# Patient Record
Sex: Female | Born: 1945 | Race: White | Hispanic: No | Marital: Single | Smoking: Former smoker
Health system: Southern US, Community
[De-identification: ages and names within clinical notes are randomized; demographics above are authoritative.]

## PROBLEM LIST (undated history)

## (undated) DIAGNOSIS — E271 Primary adrenocortical insufficiency: Secondary | ICD-10-CM

## (undated) DIAGNOSIS — E039 Hypothyroidism, unspecified: Secondary | ICD-10-CM

## (undated) HISTORY — PX: TONSILLECTOMY: SUR1361

## (undated) HISTORY — PX: HEMORROIDECTOMY: SUR656

---

## 2020-06-05 ENCOUNTER — Inpatient Hospital Stay (HOSPITAL_COMMUNITY)
Admission: EM | Admit: 2020-06-05 | Discharge: 2020-06-07 | DRG: 644 | Disposition: A | Discharge status: Home / Self Care | Payer: Medicare Other | Attending: Family Medicine | Admitting: Family Medicine

## 2020-06-05 ENCOUNTER — Encounter (HOSPITAL_COMMUNITY): Payer: Self-pay

## 2020-06-05 DIAGNOSIS — Y929 Unspecified place or not applicable: Secondary | ICD-10-CM

## 2020-06-05 DIAGNOSIS — E272 Addisonian crisis: Secondary | ICD-10-CM | POA: Diagnosis not present

## 2020-06-05 DIAGNOSIS — E860 Dehydration: Secondary | ICD-10-CM | POA: Diagnosis present

## 2020-06-05 DIAGNOSIS — R5083 Postvaccination fever: Secondary | ICD-10-CM | POA: Diagnosis present

## 2020-06-05 DIAGNOSIS — B9689 Other specified bacterial agents as the cause of diseases classified elsewhere: Secondary | ICD-10-CM | POA: Diagnosis present

## 2020-06-05 DIAGNOSIS — Z79899 Other long term (current) drug therapy: Secondary | ICD-10-CM

## 2020-06-05 DIAGNOSIS — Z87891 Personal history of nicotine dependence: Secondary | ICD-10-CM

## 2020-06-05 DIAGNOSIS — E876 Hypokalemia: Secondary | ICD-10-CM | POA: Diagnosis present

## 2020-06-05 DIAGNOSIS — E869 Volume depletion, unspecified: Secondary | ICD-10-CM | POA: Diagnosis present

## 2020-06-05 DIAGNOSIS — D849 Immunodeficiency, unspecified: Secondary | ICD-10-CM | POA: Diagnosis present

## 2020-06-05 DIAGNOSIS — E039 Hypothyroidism, unspecified: Secondary | ICD-10-CM | POA: Diagnosis present

## 2020-06-05 DIAGNOSIS — E875 Hyperkalemia: Secondary | ICD-10-CM | POA: Diagnosis present

## 2020-06-05 DIAGNOSIS — R8271 Bacteriuria: Secondary | ICD-10-CM | POA: Diagnosis present

## 2020-06-05 DIAGNOSIS — Z7952 Long term (current) use of systemic steroids: Secondary | ICD-10-CM

## 2020-06-05 DIAGNOSIS — T50B95A Adverse effect of other viral vaccines, initial encounter: Secondary | ICD-10-CM | POA: Diagnosis present

## 2020-06-05 DIAGNOSIS — N390 Urinary tract infection, site not specified: Secondary | ICD-10-CM | POA: Diagnosis present

## 2020-06-05 DIAGNOSIS — Z20822 Contact with and (suspected) exposure to covid-19: Secondary | ICD-10-CM | POA: Diagnosis present

## 2020-06-05 DIAGNOSIS — Z8249 Family history of ischemic heart disease and other diseases of the circulatory system: Secondary | ICD-10-CM

## 2020-06-05 DIAGNOSIS — E871 Hypo-osmolality and hyponatremia: Secondary | ICD-10-CM | POA: Diagnosis present

## 2020-06-05 DIAGNOSIS — E271 Primary adrenocortical insufficiency: Secondary | ICD-10-CM

## 2020-06-05 DIAGNOSIS — Z79890 Hormone replacement therapy (postmenopausal): Secondary | ICD-10-CM

## 2020-06-05 HISTORY — DX: Primary adrenocortical insufficiency: E27.1

## 2020-06-05 LAB — COMPREHENSIVE METABOLIC PANEL
ALT: 27 U/L (ref 0–44)
AST: 34 U/L (ref 15–41)
Albumin: 4.2 g/dL (ref 3.5–5.0)
Alkaline Phosphatase: 57 U/L (ref 38–126)
Anion gap: 17 — ABNORMAL HIGH (ref 5–15)
BUN: 36 mg/dL — ABNORMAL HIGH (ref 8–23)
CO2: 19 mmol/L — ABNORMAL LOW (ref 22–32)
Calcium: 9.2 mg/dL (ref 8.9–10.3)
Chloride: 97 mmol/L — ABNORMAL LOW (ref 98–111)
Creatinine, Ser: 1.42 mg/dL — ABNORMAL HIGH (ref 0.44–1.00)
GFR calc Af Amer: 41 mL/min — ABNORMAL LOW (ref >60)
GFR calc non Af Amer: 36 mL/min — ABNORMAL LOW (ref >60)
Glucose, Bld: 112 mg/dL — ABNORMAL HIGH (ref 70–99)
Potassium: 5.4 mmol/L — ABNORMAL HIGH (ref 3.5–5.1)
Sodium: 133 mmol/L — ABNORMAL LOW (ref 135–145)
Total Bilirubin: 0.9 mg/dL (ref 0.3–1.2)
Total Protein: 7.7 g/dL (ref 6.5–8.1)

## 2020-06-05 LAB — CBC WITH DIFFERENTIAL/PLATELET
Abs Immature Granulocytes: 0.02 10*3/uL (ref 0.00–0.07)
Basophils Absolute: 0 10*3/uL (ref 0.0–0.1)
Basophils Relative: 0 %
Eosinophils Absolute: 0.1 10*3/uL (ref 0.0–0.5)
Eosinophils Relative: 2 %
HCT: 48.8 % — ABNORMAL HIGH (ref 36.0–46.0)
Hemoglobin: 15.8 g/dL — ABNORMAL HIGH (ref 12.0–15.0)
Immature Granulocytes: 0 %
Lymphocytes Relative: 11 %
Lymphs Abs: 0.9 10*3/uL (ref 0.7–4.0)
MCH: 28.6 pg (ref 26.0–34.0)
MCHC: 32.4 g/dL (ref 30.0–36.0)
MCV: 88.2 fL (ref 80.0–100.0)
Monocytes Absolute: 0.7 10*3/uL (ref 0.1–1.0)
Monocytes Relative: 8 %
Neutro Abs: 6.9 10*3/uL (ref 1.7–7.7)
Neutrophils Relative %: 79 %
Platelets: 180 10*3/uL (ref 150–400)
RBC: 5.53 MIL/uL — ABNORMAL HIGH (ref 3.87–5.11)
RDW: 13.4 % (ref 11.5–15.5)
WBC: 8.7 10*3/uL (ref 4.0–10.5)
nRBC: 0 % (ref 0.0–0.2)

## 2020-06-05 LAB — URINALYSIS, ROUTINE W REFLEX MICROSCOPIC
Bilirubin Urine: NEGATIVE
Glucose, UA: NEGATIVE mg/dL
Ketones, ur: 20 mg/dL — AB
Nitrite: POSITIVE — AB
Protein, ur: NEGATIVE mg/dL
Specific Gravity, Urine: 1.012 (ref 1.005–1.030)
pH: 5 (ref 5.0–8.0)

## 2020-06-05 LAB — BASIC METABOLIC PANEL
Anion gap: 15 (ref 5–15)
BUN: 35 mg/dL — ABNORMAL HIGH (ref 8–23)
CO2: 17 mmol/L — ABNORMAL LOW (ref 22–32)
Calcium: 8.5 mg/dL — ABNORMAL LOW (ref 8.9–10.3)
Chloride: 103 mmol/L (ref 98–111)
Creatinine, Ser: 1.34 mg/dL — ABNORMAL HIGH (ref 0.44–1.00)
GFR calc Af Amer: 44 mL/min — ABNORMAL LOW (ref >60)
GFR calc non Af Amer: 38 mL/min — ABNORMAL LOW (ref >60)
Glucose, Bld: 129 mg/dL — ABNORMAL HIGH (ref 70–99)
Potassium: 4.7 mmol/L (ref 3.5–5.1)
Sodium: 135 mmol/L (ref 135–145)

## 2020-06-05 LAB — PROTIME-INR
INR: 1.1 (ref 0.8–1.2)
Prothrombin Time: 13.9 seconds (ref 11.4–15.2)

## 2020-06-05 LAB — SARS CORONAVIRUS 2 BY RT PCR (HOSPITAL ORDER, PERFORMED IN ~~LOC~~ HOSPITAL LAB): SARS Coronavirus 2: NEGATIVE

## 2020-06-05 LAB — MAGNESIUM: Magnesium: 2.1 mg/dL (ref 1.7–2.4)

## 2020-06-05 MED ORDER — SODIUM CHLORIDE 0.9 % IV BOLUS
1000.0000 mL | Freq: Once | INTRAVENOUS | Status: AC
  Administered 2020-06-05: 1000 mL via INTRAVENOUS

## 2020-06-05 MED ORDER — ACETAMINOPHEN 325 MG PO TABS: 650.0000 mg | ORAL_TABLET | Freq: Four times a day (QID) | ORAL | Status: DC | PRN

## 2020-06-05 MED ORDER — HYDROCORTISONE NA SUCCINATE PF 100 MG IJ SOLR
50.0000 mg | Freq: Four times a day (QID) | INTRAMUSCULAR | Status: DC
  Administered 2020-06-05 – 2020-06-07 (×7): 50 mg via INTRAVENOUS
  Filled 2020-06-05 (×7): qty 2

## 2020-06-05 MED ORDER — SODIUM CHLORIDE 0.9 % IV SOLN: INTRAVENOUS | Status: AC

## 2020-06-05 MED ORDER — ACETAMINOPHEN 650 MG RE SUPP: 650.0000 mg | Freq: Four times a day (QID) | RECTAL | Status: DC | PRN

## 2020-06-05 MED ORDER — ONDANSETRON HCL 4 MG/2ML IJ SOLN: 4.0000 mg | Freq: Four times a day (QID) | INTRAMUSCULAR | Status: DC | PRN

## 2020-06-05 MED ORDER — ENOXAPARIN SODIUM 40 MG/0.4ML ~~LOC~~ SOLN: 40.0000 mg | SUBCUTANEOUS | Status: DC

## 2020-06-05 MED ORDER — SODIUM CHLORIDE 0.9 % IV SOLN: INTRAVENOUS | Status: DC

## 2020-06-05 MED ORDER — SODIUM CHLORIDE 0.9 % IV SOLN
1.0000 g | Freq: Once | INTRAVENOUS | Status: AC
  Administered 2020-06-05: 1 g via INTRAVENOUS
  Filled 2020-06-05: qty 10

## 2020-06-05 MED ORDER — ONDANSETRON HCL 4 MG/2ML IJ SOLN
4.0000 mg | Freq: Once | INTRAMUSCULAR | Status: AC
  Administered 2020-06-05: 4 mg via INTRAVENOUS
  Filled 2020-06-05: qty 2

## 2020-06-05 MED ORDER — LACTATED RINGERS IV BOLUS: 1000.0000 mL | Freq: Once | INTRAVENOUS | Status: DC

## 2020-06-05 MED ORDER — HEPARIN SODIUM (PORCINE) 5000 UNIT/ML IJ SOLN
5000.0000 [IU] | Freq: Three times a day (TID) | INTRAMUSCULAR | Status: DC
  Administered 2020-06-05 – 2020-06-06 (×3): 5000 [IU] via SUBCUTANEOUS
  Filled 2020-06-05 (×3): qty 1

## 2020-06-05 MED ORDER — ONDANSETRON HCL 4 MG PO TABS: 4.0000 mg | ORAL_TABLET | Freq: Four times a day (QID) | ORAL | Status: DC | PRN

## 2020-06-05 MED ORDER — HYDROCORTISONE NA SUCCINATE PF 100 MG IJ SOLR
100.0000 mg | INTRAMUSCULAR | Status: AC
  Administered 2020-06-05: 100 mg via INTRAVENOUS
  Filled 2020-06-05: qty 2

## 2020-06-05 NOTE — ED Provider Notes (Signed)
[facility] EMERGENCY DEPARTMENT Provider Note   CSN: [id number] Arrival date & time: 06/05/20  1517     History Chief Complaint  Patient presents with  . Dehydration    Alyssa Vasquez is a 74 y.o. female.  HPI   Patient is a very pleasant 74 year old female with a known history of Addison's disease, she denies other medical problems but does take a combination of both hydrocortisone and fludrocortisone daily.  Because of her immunocompromise state the patient decided to have the Covid booster shot which she received on Wednesday.  It was the mode during a variety.  This is the same brand of the initial vaccination that she had in January and February 2021.  She states that she went to sleep on Wednesday night, she did not wake up until Friday when her daughter called her on the phone.  When she woke up on Friday having was to complete 24 hours or more she was concerned, she had not had her medications in the previous 24 to 36 hours so she took both Wednesday evenings and Thursdays and Fridays medications at the same time, again primarily the steroid combinations.  Within a short time the patient was vomiting profusely and since that time over the last 24 hours has had innumerable numbers of both vomiting and diarrhea, this includes watery and brown-colored vomit and diarrhea, it is nonbloody.  She denies having focal abdominal tenderness, denies chest pain coughing or shortness of breath.  Denies swelling of her legs.  She states that she is severely weak and is having difficulty getting around because of her weakness, she does not think that she hold down her steroids.  She has had no medications prior to arrival other than IV fluids which were initiated by the paramedics.  Past Medical History:  Diagnosis Date  . Addison disease (HCC)     There are no problems to display for this patient.   History reviewed. No pertinent surgical history.   OB History   No obstetric history  on file.     History reviewed. No pertinent family history.  Social History   Tobacco Use  . Smoking status: Not on file  Substance Use Topics  . Alcohol use: Not on file  . Drug use: Not on file    Home Medications Prior to Admission medications   Not on File    Allergies    Patient has no allergy information on record.  Review of Systems   Review of Systems  All other systems reviewed and are negative.   Physical Exam Updated Vital Signs BP 131/82   Pulse 94   Temp 98.7 F (37.1 C)   Resp 12   Ht 1.524 m (5')   Wt 54.4 kg   SpO2 99%   BMI 23.44 kg/m   Physical Exam Vitals and nursing note reviewed.  Constitutional:      General: She is not in acute distress.    Appearance: She is well-developed.  HENT:     Head: Normocephalic and atraumatic.     Mouth/Throat:     Mouth: Mucous membranes are dry.     Pharynx: No oropharyngeal exudate.  Eyes:     General: No scleral icterus.       Right eye: No discharge.        Left eye: No discharge.     Conjunctiva/sclera: Conjunctivae normal.     Pupils: Pupils are equal, round, and reactive to light.  Neck:  Thyroid: No thyromegaly.     Vascular: No JVD.  Cardiovascular:     Rate and Rhythm: Normal rate and regular rhythm.     Heart sounds: Normal heart sounds. No murmur heard.  No friction rub. No gallop.   Pulmonary:     Effort: Pulmonary effort is normal. No respiratory distress.     Breath sounds: Normal breath sounds. No wheezing or rales.  Abdominal:     General: Bowel sounds are normal. There is no distension.     Palpations: Abdomen is soft. There is no mass.     Tenderness: There is abdominal tenderness.     Comments: Minimal mid abdominal tenderness without guarding, no Murphy sign or McBurney point tenderness  Musculoskeletal:        General: No tenderness. Normal range of motion.     Cervical back: Normal range of motion and neck supple.  Lymphadenopathy:     Cervical: No cervical  adenopathy.  Skin:    General: Skin is warm and dry.     Findings: No erythema or rash.  Neurological:     Mental Status: She is alert.     Coordination: Coordination normal.  Psychiatric:        Behavior: Behavior normal.     ED Results / Procedures / Treatments   Labs (all labs ordered are listed, but only abnormal results are displayed) Labs Reviewed  URINE CULTURE  COMPREHENSIVE METABOLIC PANEL  CBC WITH DIFFERENTIAL/PLATELET  PROTIME-INR  MAGNESIUM  URINALYSIS, ROUTINE W REFLEX MICROSCOPIC    EKG None  Radiology No results found.  Procedures Procedures (including critical care time)  Medications Ordered in ED Medications  ondansetron (ZOFRAN) injection 4 mg (has no administration in time range)  0.9 %  sodium chloride infusion (has no administration in time range)  hydrocortisone sodium succinate (SOLU-CORTEF) 100 MG injection 100 mg (has no administration in time range)    ED Course  I have reviewed the triage vital signs and the nursing notes.  Pertinent labs & imaging results that were available during my care of the patient were reviewed by me and considered in my medical decision making (see chart for details).  Clinical Course as of Jun 12 2000  Sat Jun 05, 2020  1749 On recheck the patient is feeling better after Zofran.  She has been given Solu-Cortef, her heart rate remains around 90 with a normal blood pressure and oxygen level.  Her laboratory work-up unfortunately is showing a mild acute kidney injury with elevated BUN, she has some slight electrolyte abnormalities as well however these may be in part related to her underlying Addison's.  I have compared her labs to labs that the family member has brought with them showing that her creatinine in November 2020 was normal.  She will continue to get IV fluids and a recheck of her basic metabolic panel in hopes of discharge that she is feeling better.  Urinalysis pending.  The family member who has arrived  now states that she actually did talk to the patient on Thursday and she did not in fact sleep that entire time.  She did complain of having a fever at that time as high as 101, that was within 24 hours of having the vaccination   [BM]    Clinical Course User Index [BM] Eber Hong, MD   MDM Rules/Calculators/A&P  This patient is dry the mucous membranes and appears very weak though she is able to perform all the commands that I ask.  Her speech is clear and cranial nerves III through XII are unremarkable.  This is more consistent with a dehydrated state.  She will need intravenous Solu-Cortef as well as labs checked to rule out kidney dysfunction, liver dysfunction, dehydration complications such as uremia, hypokalemia, hypomagnesemia and will rule out urinary tract infection as well given her immunocompromise state.  The patient is agreeable to the plan.  Her vital signs at this point are reassuring.  Admitted to hospitalist for possible Addison's Crisis  Vitals:   06/05/20 1525  BP: 131/82  Pulse: 94  Resp: 12  Temp: 98.7 F (37.1 C)  SpO2: 99%   Alyssa Vasquez was evaluated in Emergency Department on 06/05/2020 for the symptoms described in the history of present illness. She was evaluated in the context of the global COVID-19 pandemic, which necessitated consideration that the patient might be at risk for infection with the SARS-CoV-2 virus that causes COVID-19. Institutional protocols and algorithms that pertain to the evaluation of patients at risk for COVID-19 are in a state of rapid change based on information released by regulatory bodies including the CDC and federal and state organizations. These policies and algorithms were followed during the patient's care in the ED.   Final Clinical Impression(s) / ED Diagnoses Final diagnoses:  Autoimmune Addison's disease (HCC)      Eber Hong, MD 06/12/20 2003

## 2020-06-05 NOTE — ED Triage Notes (Addendum)
Pt received her Moderna booster shot 3 days ago. Since then, she has been having weakness and dehydration since then. She was extremely pale. EMS unable to get O2 sats. CBG 221. Has had 250 mL of fluids. BP has improved. History of Addison's disease.  She has been vomiting and hasn't been able to keep her medications down. Pt cannot have Potassium.

## 2020-06-05 NOTE — H&P (Signed)
History and Physical    Alyssa Vasquez NID:[id number] DOB: Dec 09, 1945 DOA: 06/05/2020  PCP: Tania Ade, MD   Patient coming from: Home.  I have personally briefly reviewed patient's old medical records in [facility]  Chief Complaint: Weakness, nausea and vomiting.  HPI: Alyssa Vasquez is a 74 y.o. female with medical history significant of Addison's disease who is coming to the emergency department due to not feeling well after receiving the COVID-19 third dose of the Moderna vaccine on Wednesday.  The patient states that she has been having low-grade fevers, chills, generalized weakness, multiple episodes of emesis, diarrhea with decreased oral intake and inability to not vomit her oral medications after administration.  She denies headache, rhinorrhea, sore throat, productive cough, dyspnea, wheezing or hemoptysis.  No chest pain, palpitations, PND, orthopnea or pitting edema of the lower extremities.  She denies melena or hematochezia.  No dysuria, flank pain, frequency or hematuria.  She has been urinating less than usual.  No polyuria, polydipsia, polyphagia or blurred vision. She was confused initially thinking that she had been sleeping from Wednesday through Friday, but her daughter subsequently explained to her that they spoke on Thursday.  The patient seems to be mentating better after initial treatment.  ED Course: Initial vital signs temperature 98.7 F, pulse 94, respirations 12, BP 131/82, blood pressure 99% on room air.  The patient received IV fluids and 1 g of ceftriaxone IVPB in the emergency department.  Urinalysis showed ketonuria with 20 mg/dL, moderate hemoglobinuria, positive nitrates and trace leukocyte esterase.  Microscopic examination showed many bacteria, but was otherwise unremarkable.  CBC shows a white count of 8.7, hemoglobin 15.8 g/dL and platelets 144.  PT was 13.9 and INR 1.1.  LFTs were normal.  Sodium 133, potassium 5.4, chloride 97 and CO2 19  mmol/L.  Anion gap was 17.  Glucose 112, BUN 36 and creatinine 1.42 mg/dL.  Review of Systems: As per HPI otherwise all other systems reviewed and are negative.  Past Medical History:  Diagnosis Date  . Addison disease (HCC)     History reviewed. No pertinent surgical history.  Social History  reports that she has quit smoking. She has never used smokeless tobacco. No history on file for alcohol use and drug use.  No Known Allergies   Family medical history Her younger brother has CAD with multiple coronary stents placement.  Prior to Admission medications   Medication Sig Start Date End Date Taking? Authorizing Provider  dexamethasone (DECADRON) 0.5 MG/5ML solution Take 4 mg by mouth daily as needed.   Yes [provider]  fludrocortisone (FLORINEF) 0.1 MG tablet Take 0.1 mg by mouth daily.   Yes [provider]  hydrocortisone (CORTEF) 20 MG tablet Take 20 mg by mouth daily.   Yes [provider]  levothyroxine (SYNTHROID) 50 MCG tablet Take 50 mcg by mouth. Every other day (not on same day as )   Yes [provider]  levothyroxine (SYNTHROID) 75 MCG tablet Take 75 mcg by mouth. Every other day (not on the same day as )   Yes [provider]   Physical Exam: Vitals:   06/05/20 1915 06/05/20 1930 06/05/20 2000 06/05/20 2207  BP:  (!) 118/59 117/68 117/68  Pulse: 93 88 89 89  Resp: 11 13 12 16   Temp:    (!) 97.5 F (36.4 C)  TempSrc:    Oral  SpO2: 96% 96% 96% 96%  Weight:      Height:  Constitutional: NAD, calm, comfortable Eyes: PERRL, lids and conjunctivae normal ENMT: Mucous membranes are dry. Posterior pharynx clear of any exudate or lesions. Neck: normal, supple, no masses, no thyromegaly Respiratory: clear to auscultation bilaterally, no wheezing, no crackles. Normal respiratory effort. No accessory muscle use.  Cardiovascular: Regular rate and rhythm, no murmurs / rubs / gallops. No extremity edema. 2+  pedal pulses. No carotid bruits.  Abdomen: Nondistended. Bowel sounds positive.  Soft, no tenderness, no masses palpated. No hepatosplenomegaly.  Musculoskeletal: Generalized weakness.  No clubbing / cyanosis. Good ROM, no contractures. Normal muscle tone.  Skin: no clinically significant rashes, lesions, ulcers on very limited dermatological examination. Neurologic: CN 2-12 grossly intact. Sensation intact, DTR normal. Strength 5/5 in all 4.  Psychiatric: Normal judgment and insight. Alert and oriented x 3. Normal mood.   Labs on Admission: I have personally reviewed following labs and imaging studies  CBC: Recent Labs  Lab 06/05/20 1600  WBC 8.7  NEUTROABS 6.9  HGB 15.8*  HCT 48.8*  MCV 88.2  PLT 180    Basic Metabolic Panel: Recent Labs  Lab 06/05/20 1600 06/05/20 2029  NA 133* 135  K 5.4* 4.7  CL 97* 103  CO2 19* 17*  GLUCOSE 112* 129*  BUN 36* 35*  CREATININE 1.42* 1.34*  CALCIUM 9.2 8.5*  MG 2.1  --     GFR: Estimated Creatinine Clearance: 25.7 mL/min (A) (by C-G formula based on SCr of 1.34 mg/dL (H)).  Liver Function Tests: Recent Labs  Lab 06/05/20 1600  AST 34  ALT 27  ALKPHOS 57  BILITOT 0.9  PROT 7.7  ALBUMIN 4.2    Urine analysis:    Component Value Date/Time   COLORURINE YELLOW 06/05/2020 2005   APPEARANCEUR CLEAR 06/05/2020 2005   LABSPEC 1.012 06/05/2020 2005   PHURINE 5.0 06/05/2020 2005   GLUCOSEU NEGATIVE 06/05/2020 2005   HGBUR MODERATE (A) 06/05/2020 2005   BILIRUBINUR NEGATIVE 06/05/2020 2005   KETONESUR 20 (A) 06/05/2020 2005   PROTEINUR NEGATIVE 06/05/2020 2005   NITRITE POSITIVE (A) 06/05/2020 2005   LEUKOCYTESUR TRACE (A) 06/05/2020 2005    Radiological Exams on Admission: No results found.  EKG: Independently reviewed. Vent. rate 95 BPM PR interval * ms QRS duration 72 ms QT/QTc 363/457 ms P-R-T axes 66 -5 31 Sinus rhythm Abnormal R-wave progression, early transition  Assessment/Plan Principal Problem:    Addisonian crisis (HCC) Place in observation/telemetry. Continue IV fluids. Continue hydrocortisone 50 mg IVP every 6 hours. Monitor intake and output. Follow renal function electrolytes. Continue Florinef 0.1 mg p.o. daily. Continue levothyroxine. Switch to oral hydrocortisone after crisis resolved.  Active Problems: Asymptomatic bacteriuria No GU symptoms per patient. Monitor for symptoms. Defer further antibiotics for now. Follow urine culture and sensitivity.    Volume depletion Continue IV fluids. Monitor intake and output. Monitor renal function electrolytes.    Hypokalemia Resolve after IV fluids. Continue IVF. Continue glucocorticoids Monitor potassium level.    Hyponatremia Continue IV fluids for now and monitor sodium level.    Hypothyroidism Continue levothyroxine.    DVT prophylaxis: Heparin SQ. Code Status:   Full code. Family Communication: Disposition Plan:   Patient is from:  Home.  Anticipated DC to:  Home.  Anticipated DC date:  06/06/2020.  Anticipated DC barriers: Clinical improvement.  Consults called: Admission status:  Observation/telemetry.   Severity of Illness: High asked the patient was already volume depleted, hyponatremic and hyperkalemic at the moment of presentation.  Continued treatment is needed to prevent further worsening  of symptoms.  Bobette Mo MD [facility]  How to contact the [facility] Attending or Consulting provider 7A - 7P or covering provider during after hours 7P -7A, for this patient?   1. Check the care team in [facility] and look for a) attending/consulting [facility] provider listed and b) the [facility] team listed 2. Log into [email or web address] and use [place]'s universal password to access. If you do not have the password, please contact the hospital operator. 3. Locate the [facility] provider you are looking for under [facility] and page to a number that you can be directly reached. 4. If you still have difficulty  reaching the provider, please page the [facility] (Director on Call) for the Hospitalists listed on amion for assistance.  06/05/2020, 10:20 PM   This document was prepared using Dragon voice recognition software and may contain some unintended transcription errors.

## 2020-06-05 NOTE — ED Notes (Signed)
Went in the room with Cytogeneticist. Pt asked to speak to nurse. She was upset that there was no label on the bag. Anne RN informed her we did not do this anymore. She was also upset that she felt like she didn't get ZOFRAN. Pt did receive  Zofran.

## 2020-06-05 NOTE — ED Notes (Signed)
Pt's daughter has all medical history information. Upon arrival, will udpate this in chart.

## 2020-06-05 NOTE — ED Notes (Signed)
Pt asked for nurse to come to room   Went to room w E[place]i RN, CN   Pt upset that there is no label on her IV bag Feels that she did not get zofran   Pt reminded that she got both zofran and steroid as a push  Pt then reports she was not told about the steroid   (pt was scanned , meds delivered and pt told of meds per this RN practice hx)  Daughter at bedside   She is assured that she has received what the physician has ordered and should she feel that she needs more medication for N that physician can be asked for same   Active listening by both this RN and CN

## 2020-06-05 NOTE — ED Notes (Signed)
Pt still unable to provide urine sample at this time. Pt requested bedpan to keep at her side and have her family member assist if she needs to go.

## 2020-06-05 NOTE — ED Notes (Signed)
Pt refused being wheeled to bathroom to collect clean catch urine sample. Patient also refused use of bedside commode when it was offered. Pt insists that she can collect clean catch sample with her bedpan. RN notified

## 2020-06-05 NOTE — ED Notes (Signed)
Pt urine sample obtained by pt sitting on a bedpan and holding the sample cup inside the pan. Pt wiped with ob cleansing wipe before obtaining sample.

## 2020-06-05 NOTE — ED Notes (Signed)
Pt has not urinated at this time 

## 2020-06-05 NOTE — ED Notes (Signed)
Gave pt ice chips per pt request (verified with RN first), informed pt that she will need to give [place] a urine sample when she is able

## 2020-06-05 NOTE — ED Notes (Signed)
Pt assigned rm 320  X 53 minutes   Call to inquire when room will be marked ready   "I don't think we can take anymore patients"  ED with several Emergent patients  Asked that when figured out to call back

## 2020-06-06 ENCOUNTER — Other Ambulatory Visit: Payer: Self-pay

## 2020-06-06 DIAGNOSIS — Z8249 Family history of ischemic heart disease and other diseases of the circulatory system: Secondary | ICD-10-CM | POA: Diagnosis not present

## 2020-06-06 DIAGNOSIS — E871 Hypo-osmolality and hyponatremia: Secondary | ICD-10-CM | POA: Diagnosis present

## 2020-06-06 DIAGNOSIS — E860 Dehydration: Secondary | ICD-10-CM | POA: Diagnosis present

## 2020-06-06 DIAGNOSIS — Z7952 Long term (current) use of systemic steroids: Secondary | ICD-10-CM | POA: Diagnosis not present

## 2020-06-06 DIAGNOSIS — Z79899 Other long term (current) drug therapy: Secondary | ICD-10-CM | POA: Diagnosis not present

## 2020-06-06 DIAGNOSIS — T50B95A Adverse effect of other viral vaccines, initial encounter: Secondary | ICD-10-CM | POA: Diagnosis present

## 2020-06-06 DIAGNOSIS — Y929 Unspecified place or not applicable: Secondary | ICD-10-CM | POA: Diagnosis not present

## 2020-06-06 DIAGNOSIS — N390 Urinary tract infection, site not specified: Secondary | ICD-10-CM | POA: Diagnosis present

## 2020-06-06 DIAGNOSIS — Z20822 Contact with and (suspected) exposure to covid-19: Secondary | ICD-10-CM | POA: Diagnosis present

## 2020-06-06 DIAGNOSIS — Z87891 Personal history of nicotine dependence: Secondary | ICD-10-CM | POA: Diagnosis not present

## 2020-06-06 DIAGNOSIS — D849 Immunodeficiency, unspecified: Secondary | ICD-10-CM | POA: Diagnosis present

## 2020-06-06 DIAGNOSIS — E876 Hypokalemia: Secondary | ICD-10-CM | POA: Diagnosis present

## 2020-06-06 DIAGNOSIS — Z79890 Hormone replacement therapy (postmenopausal): Secondary | ICD-10-CM | POA: Diagnosis not present

## 2020-06-06 DIAGNOSIS — E272 Addisonian crisis: Secondary | ICD-10-CM | POA: Diagnosis present

## 2020-06-06 DIAGNOSIS — R5083 Postvaccination fever: Secondary | ICD-10-CM | POA: Diagnosis present

## 2020-06-06 DIAGNOSIS — E875 Hyperkalemia: Secondary | ICD-10-CM | POA: Diagnosis present

## 2020-06-06 DIAGNOSIS — E039 Hypothyroidism, unspecified: Secondary | ICD-10-CM | POA: Diagnosis present

## 2020-06-06 DIAGNOSIS — B9689 Other specified bacterial agents as the cause of diseases classified elsewhere: Secondary | ICD-10-CM | POA: Diagnosis present

## 2020-06-06 LAB — BASIC METABOLIC PANEL
Anion gap: 9 (ref 5–15)
BUN: 30 mg/dL — ABNORMAL HIGH (ref 8–23)
CO2: 18 mmol/L — ABNORMAL LOW (ref 22–32)
Calcium: 7.8 mg/dL — ABNORMAL LOW (ref 8.9–10.3)
Chloride: 107 mmol/L (ref 98–111)
Creatinine, Ser: 0.98 mg/dL (ref 0.44–1.00)
GFR calc Af Amer: >60 mL/min (ref >60)
GFR calc non Af Amer: 56 mL/min — ABNORMAL LOW (ref >60)
Glucose, Bld: 153 mg/dL — ABNORMAL HIGH (ref 70–99)
Potassium: 4.3 mmol/L (ref 3.5–5.1)
Sodium: 134 mmol/L — ABNORMAL LOW (ref 135–145)

## 2020-06-06 LAB — MAGNESIUM: Magnesium: 1.9 mg/dL (ref 1.7–2.4)

## 2020-06-06 LAB — CBC
HCT: 39.5 % (ref 36.0–46.0)
Hemoglobin: 12.4 g/dL (ref 12.0–15.0)
MCH: 28.2 pg (ref 26.0–34.0)
MCHC: 31.4 g/dL (ref 30.0–36.0)
MCV: 89.8 fL (ref 80.0–100.0)
Platelets: 153 10*3/uL (ref 150–400)
RBC: 4.4 MIL/uL (ref 3.87–5.11)
RDW: 13.6 % (ref 11.5–15.5)
WBC: 4.3 10*3/uL (ref 4.0–10.5)
nRBC: 0 % (ref 0.0–0.2)

## 2020-06-06 LAB — PHOSPHORUS: Phosphorus: 3.2 mg/dL (ref 2.5–4.6)

## 2020-06-06 MED ORDER — HYDROCORTISONE 20 MG PO TABS: 20.0000 mg | ORAL_TABLET | Freq: Every day | ORAL | Status: DC

## 2020-06-06 MED ORDER — LEVOTHYROXINE SODIUM 75 MCG PO TABS
75.0000 ug | ORAL_TABLET | Freq: Every day | ORAL | Status: DC
  Administered 2020-06-06 – 2020-06-07 (×2): 75 ug via ORAL
  Filled 2020-06-06 (×2): qty 1

## 2020-06-06 MED ORDER — SODIUM CHLORIDE 0.9 % IV SOLN
1.0000 g | Freq: Once | INTRAVENOUS | Status: AC
  Administered 2020-06-06: 1 g via INTRAVENOUS
  Filled 2020-06-06: qty 10

## 2020-06-06 MED ORDER — LEVOTHYROXINE SODIUM 50 MCG PO TABS
50.0000 ug | ORAL_TABLET | Freq: Every day | ORAL | Status: DC
  Administered 2020-06-07: 50 ug via ORAL
  Filled 2020-06-06: qty 1

## 2020-06-06 MED ORDER — FLUDROCORTISONE ACETATE 0.1 MG PO TABS
0.1000 mg | ORAL_TABLET | Freq: Every day | ORAL | Status: DC
  Administered 2020-06-06 – 2020-06-07 (×2): 0.1 mg via ORAL
  Filled 2020-06-06 (×2): qty 1

## 2020-06-06 NOTE — Progress Notes (Signed)
Patient Demographics:    Alyssa Vasquez, is a 74 y.o. female, DOB - 12/13/1945, ZOX:[id number]  Admit date - 06/05/2020   Admitting Physician Shaindel Sweeten Mariea Clonts, MD  Outpatient Primary MD for the patient is Stall, Shawnie Dapper, MD  LOS - 0   Chief Complaint  Patient presents with  . Dehydration        Subjective:    Alyssa Vasquez today has no fevers, no further emesis,  No chest pain,   --Dizziness improving  Assessment  & Plan :    Principal Problem:   Addisonian crisis [facility]) Active Problems:   Hypothyroidism   Hypokalemia   Hyponatremia   Volume depletion   Asymptomatic bacteriuria  Brief Summary:- 74 y.o. female with medical history significant of Addison's disease and hypothyroidism who is chronically on Florinef and hydrocortisone since 1993, with chronic immunosuppression who received COVID-19 third dose of the Moderna vaccine on 06/02/20 and subsequently developed nausea vomiting and diarrhea on 06/03/2020 --She was unable to keep down her hydrocortisone and Florinef- since 06/03/20  due to vomiting and diarrhea (which may be a side effect from Covid booster injection)---admitted 06/05/2020 with addisonian crisis requiring IV hydrocortisone --  A/p 1)Addisonian Crisis--- diagnosed back with Addison's disease in 1993,  --patient has not had an additional crisis in about 7 years now -Continue IV hydrocortisone -Continue IV fluids --Please see subjective section above -May be able to transition back to oral hydrocortisone on 06/07/2020 -Patient sees endocrinologist Dr. Sherrine Maples stall in [place]  2) hypothyroidism--- PTA patient took alternating doses of 75 and 50 mcg --Continue 75 mcg daily given missed doses over the last few days  3) post vaccination reaction----patient had low-grade fevers, chills, myalgias, generalized weakness, persistent emesis and diarrhea--- since receiving [place] COVID-19  post injection on 06/02/2020  --- Symptoms are resolving resolving  4) possible UTI--- UA may have been contaminated for patient's persistent diarrhea -Continue IV Rocephin pending urine culture results  5) hyponatremia/hypokalemia--- in the setting of vomiting and diarrhea with GI losses as well-in the setting of addisonian crisis, replace and recheck  Disposition/Need for in-Hospital Stay- patient unable to be discharged at this time due to --- addisonian crisis requiring IV hydrocortisone, IV fluids due to dehydration and persistent vomiting or diarrhea as well as UTI requiring IV Rocephin  Status is: Inpatient  Remains inpatient appropriate because:addisonian crisis requiring IV hydrocortisone, IV fluids due to dehydration and persistent vomiting or diarrhea as well as UTI requiring IV Rocephin   Disposition: The patient is from: Home              Anticipated d/c is to: Home              Anticipated d/c date is: 1 day              Patient currently is not medically stable to d/c. Barriers: Not Clinically Stable- addisonian crisis requiring IV hydrocortisone, IV fluids due to dehydration and persistent vomiting or diarrhea as well as UTI requiring IV Rocephin  Code Status : Full code  Family Communication:     (patient is alert, awake and coherent)    Consults  :  na  DVT Prophylaxis  :    - Heparin - SCD  Lab Results  Component Value Date   PLT 153 06/06/2020    Inpatient Medications  Scheduled Meds: . fludrocortisone  0.1 mg Oral Daily  . heparin  5,000 Units Subcutaneous Q8H  . hydrocortisone sod succinate (SOLU-CORTEF) inj  50 mg Intravenous Q6H  . [START ON 06/07/2020] levothyroxine  50 mcg Oral Q0600  . levothyroxine  75 mcg Oral Q0600   Continuous Infusions: . sodium chloride 125 mL/hr at 06/06/20 1111  . cefTRIAXone (ROCEPHIN)  IV     PRN Meds:.acetaminophen **OR** acetaminophen, ondansetron **OR** ondansetron (ZOFRAN) IV    Anti-infectives (From admission,  onward)   Start     Dose/Rate Route Frequency Ordered Stop   06/06/20 1600  cefTRIAXone (ROCEPHIN) 1 g in sodium chloride 0.9 % 100 mL IVPB        1 g 200 mL/hr over 30 Minutes Intravenous  Once 06/06/20 1457     06/05/20 2115  cefTRIAXone (ROCEPHIN) 1 g in sodium chloride 0.9 % 100 mL IVPB        1 g 200 mL/hr over 30 Minutes Intravenous  Once 06/05/20 2107 06/05/20 2159        Objective:   Vitals:   06/06/20 0140 06/06/20 0532 06/06/20 0940 06/06/20 1342  BP: 122/61 116/65 117/75 (!) 117/59  Pulse: 84 84 89 (!) 101  Resp: 18 18 16 19   Temp: 98.2 F (36.8 C)  (!) 97.5 F (36.4 C) 98.2 F (36.8 C)  TempSrc: Oral  Oral Oral  SpO2: 98% 99% 99% 99%  Weight:      Height:        Wt Readings from Last 3 Encounters:  06/05/20 54.4 kg     Intake/Output Summary (Last 24 hours) at 06/06/2020 1518 Last data filed at 06/06/2020 0400 Gross per 24 hour  Intake 131.18 ml  Output --  Net 131.18 ml     Physical Exam  Gen:- Awake Alert,  In no apparent distress  HEENT:- [place].AT, No sclera icterus Neck-Supple Neck,No JVD,.  Lungs-  CTAB , fair symmetrical air movement CV- S1, S2 normal, regular  Abd-  +ve B.Sounds, Abd Soft, No tenderness,    Extremity/Skin:- No  edema, pedal pulses present  Psych-affect is appropriate, oriented x3 Neuro-generalized weakness, no new focal deficits, no tremors   Data Review:   Micro Results Recent Results (from the past 240 hour(s))  SARS Coronavirus 2 by RT PCR (hospital order, performed in [facility] hospital lab) Nasopharyngeal Nasopharyngeal Swab     Status: None   Collection Time: 06/05/20 10:00 PM   Specimen: Nasopharyngeal Swab  Result Value Ref Range Status   SARS Coronavirus 2 NEGATIVE NEGATIVE Final    Comment: (NOTE) SARS-CoV-2 target nucleic acids are NOT DETECTED.  The SARS-CoV-2 RNA is generally detectable in upper and lower respiratory specimens during the acute phase of infection. The lowest concentration of SARS-CoV-2  viral copies this assay can detect is 250 copies / mL. A negative result does not preclude SARS-CoV-2 infection and should not be used as the sole basis for treatment or other patient management decisions.  A negative result may occur with improper specimen collection / handling, submission of specimen other than nasopharyngeal swab, presence of viral mutation(s) within the areas targeted by this assay, and inadequate number of viral copies (<250 copies / mL). A negative result must be combined with clinical observations, patient history, and epidemiological information.  Fact Sheet for Patients:   06/07/20  Fact Sheet for Healthcare Providers: BoilerBrush.com.cy  This test is not yet approved  or  cleared by the [place] and has been authorized for detection and/or diagnosis of SARS-CoV-2 by FDA under an Emergency Use Authorization (EUA).  This EUA will remain in effect (meaning this test can be used) for the duration of the COVID-19 declaration under Section 564(b)(1) of the Act, 21 U.S.C. section 360bbb-3(b)(1), unless the authorization is terminated or revoked sooner.  Performed at [facility], [address], [place], [place] [address]     Radiology Reports No results found.   CBC Recent Labs  Lab 06/05/20 1600 06/06/20 0723  WBC 8.7 4.3  HGB 15.8* 12.4  HCT 48.8* 39.5  PLT 180 153  MCV 88.2 89.8  MCH 28.6 28.2  MCHC 32.4 31.4  RDW 13.4 13.6  LYMPHSABS 0.9  --   MONOABS 0.7  --   EOSABS 0.1  --   BASOSABS 0.0  --     Chemistries  Recent Labs  Lab 06/05/20 1600 06/05/20 2029 06/06/20 0723  NA 133* 135 134*  K 5.4* 4.7 4.3  CL 97* 103 107  CO2 19* 17* 18*  GLUCOSE 112* 129* 153*  BUN 36* 35* 30*  CREATININE 1.42* 1.34* 0.98  CALCIUM 9.2 8.5* 7.8*  MG 2.1  --  1.9  AST 34  --   --   ALT 27  --   --   ALKPHOS 57  --   --   BILITOT 0.9  --   --     ------------------------------------------------------------------------------------------------------------------ No results for input(s): CHOL, HDL, LDLCALC, TRIG, CHOLHDL, LDLDIRECT in the last 72 hours.  No results found for: HGBA1C ------------------------------------------------------------------------------------------------------------------ No results for input(s): TSH, T4TOTAL, T3FREE, THYROIDAB in the last 72 hours.  Invalid input(s): FREET3 ------------------------------------------------------------------------------------------------------------------ No results for input(s): VITAMINB12, FOLATE, FERRITIN, TIBC, IRON, RETICCTPCT in the last 72 hours.  Coagulation profile Recent Labs  Lab 06/05/20 1600  INR 1.1    No results for input(s): DDIMER in the last 72 hours.  Cardiac Enzymes No results for input(s): CKMB, TROPONINI, MYOGLOBIN in the last 168 hours.  Invalid input(s): CK ------------------------------------------------------------------------------------------------------------------ No results found for: BNP   Shon Hale M.D on 06/06/2020 at 3:18 PM  Go to [email or web address] - for contact info  [facility] - Office  [phone]

## 2020-06-07 MED ORDER — ACETAMINOPHEN 325 MG PO TABS: 650.0000 mg | ORAL_TABLET | Freq: Four times a day (QID) | ORAL | 0 refills | Status: AC | PRN

## 2020-06-07 MED ORDER — CEPHALEXIN 500 MG PO CAPS
500.0000 mg | ORAL_CAPSULE | Freq: Three times a day (TID) | ORAL | 0 refills | Status: AC
Start: 2020-06-07 — End: 2020-06-10

## 2020-06-07 NOTE — Discharge Summary (Signed)
Alyssa Vasquez, is a 74 y.o. female  DOB 10/12/1945  MRN [id number].  Admission date:  06/05/2020  Admitting Physician  Shon Haleourage Saryna Kneeland, MD  Discharge Date:  06/07/2020   Primary MD  Tania AdeStall, Glenn M, MD  Recommendations for primary care physician for things to follow:   1)Your addisonian crisis appears to have resolved--please resume your previous doses of hydrocortisone and Florinef without any changes 2)Please follow-up [facility]  for [facility] of your thyroid disorder and Addison's disease 3) please take Keflex antibiotic as prescribed for UTI  Admission Diagnosis  Addisonian crisis [facility](HCC) [E27.2]   Discharge Diagnosis  Addisonian crisis [facility](HCC) [E27.2]    Principal Problem:   Addisonian crisis (HCC) Active Problems:   Hypothyroidism   Hypokalemia   Hyponatremia   Volume depletion   Asymptomatic bacteriuria      Past Medical History:  Diagnosis Date  . Addison disease (HCC)     History reviewed. No pertinent surgical history.    HPI  from the history and physical done on the day of admission:    Chief Complaint: Weakness, nausea and vomiting.  HPI: Alyssa StarchMary Ellen Vasquez is a 74 y.o. female with medical history significant of Addison's disease who is coming to the emergency department due to not feeling well after receiving the COVID-19 third dose of the Moderna vaccine on Wednesday.  The patient states that she has been having low-grade fevers, chills, generalized weakness, multiple episodes of emesis, diarrhea with decreased oral intake and inability to not vomit her oral medications after administration.  She denies headache, rhinorrhea, sore throat, productive cough, dyspnea, wheezing or hemoptysis.  No chest pain, palpitations, PND, orthopnea or pitting edema of the lower extremities.  She denies melena or hematochezia.  No  dysuria, flank pain, frequency or hematuria.  She has been urinating less than usual.  No polyuria, polydipsia, polyphagia or blurred vision. She was confused initially thinking that she had been sleeping from Wednesday through Friday, but her daughter subsequently explained to her that they spoke on Thursday.  The patient seems to be mentating better after initial treatment.  ED Course: Initial vital signs temperature 98.7 F, pulse 94, respirations 12, BP 131/82, blood pressure 99% on room air.  The patient received IV fluids and 1 g of ceftriaxone IVPB in the emergency department.  Urinalysis showed ketonuria with 20 mg/dL, moderate hemoglobinuria, positive nitrates and trace leukocyte esterase.  Microscopic examination showed many bacteria, but was otherwise unremarkable.  CBC shows a white count of 8.7, hemoglobin 15.8 g/dL and platelets 409180.  PT was 13.9 and INR 1.1.  LFTs were normal.  Sodium 133, potassium 5.4, chloride 97 and CO2 19 mmol/L.  Anion gap was 17.  Glucose 112, BUN 36 and creatinine 1.42 mg/dL.  Review of Systems: As per HPI otherwise all other systems reviewed and are negative.   Hospital Course:   Brief Summary:- 74 y.o.femalewith medical history significant ofAddison's disease and hypothyroidism who is chronically on Florinef and hydrocortisone  since 1993, with chronic immunosuppression who received COVID-19 third dose of the Moderna vaccineon 06/02/20 and subsequently developed nausea vomiting and diarrhea on 06/03/2020 --She was unable to keep down her hydrocortisone and Florinef- since 06/03/20  due to vomiting and diarrhea (which may be a side effect from Covid booster injection)---admitted 06/05/2020 with addisonian crisis requiring IV hydrocortisone --  A/p 1)Addisonian Crisis--- diagnosed  with Addison's disease in 1993,  --patient has not had an additional crisis in about 7 years now -Additional crisis was triggered by vomiting and diarrhea leading to inability  to keep down his usual hydrocortisone and Florinef doses -Treated with IV fluids and IV hydrocortisone --Please see subjective section above - transition back to oral hydrocortisone on 06/07/2020 -Patient sees endocrinologist Dr. Sherrine Maples stall in [place]--outpatient follow-up advised --Orthostatic vitals noted today  2) hypothyroidism--- PTA patient took alternating doses of 75 and 50 mcg -resume home regimen  3) post vaccination reaction----patient had low-grade fevers, chills, myalgias, generalized weakness, persistent emesis and diarrhea--- since receiving Moderna COVID-19 post injection on 06/02/2020  --- Symptoms resolved  4)GNR  UTI---  treated with IV Rocephin, discharged on p.o. Keflex\  5) hyponatremia/hypokalemia--- in the setting of vomiting and diarrhea with GI losses as well-in the setting of addisonian crisis, replaced  Disposition/Need for in-Hospital Stay- patient unable to be discharged at this time due to --- addisonian crisis requiring IV hydrocortisone, IV fluids due to dehydration and persistent vomiting or diarrhea as well as UTI requiring IV Rocephin  Status is: Inpatient  Remains inpatient appropriate because:addisonian crisis requiring IV hydrocortisone, IV fluids due to dehydration and persistent vomiting or diarrhea as well as UTI requiring IV Rocephin   Disposition: The patient is from: Home  Anticipated d/c is to: Home  Anticipated d/c date is: 1 day  Patient currently is not medically stable to d/c. Barriers: Not Clinically Stable- addisonian crisis requiring IV hydrocortisone, IV fluids due to dehydration and persistent vomiting or diarrhea as well as UTI requiring IV Rocephin  Code Status : Full code  Family Communication:     (patient is alert, awake and coherent)    Consults  :  na  Discharge Condition: stable  Follow UP--endocrinologist Dr. West Pugh   Consults obtained - na  Diet and Activity  recommendation:  As advised  Discharge Instructions    Discharge Instructions    Call MD for:  difficulty breathing, headache or visual disturbances   Complete by: As directed    Call MD for:  persistant dizziness or light-headedness   Complete by: As directed    Call MD for:  persistant nausea and vomiting   Complete by: As directed    Call MD for:  severe uncontrolled pain   Complete by: As directed    Call MD for:  temperature >100.4   Complete by: As directed    Diet general   Complete by: As directed    Discharge instructions   Complete by: As directed    1)Your addisonian crisis appears to have resolved--please resume your previous doses of hydrocortisone and Florinef without any changes 2)Please follow-up with your endocrinologist Dr. West Pugh in [facility]  for management of your thyroid disorder and Addison's disease 3) please take Keflex antibiotic as prescribed for UTI   Increase activity slowly   Complete by: As directed         Discharge Medications     Allergies as of 06/07/2020   No Known Allergies     Medication List  TAKE these medications   acetaminophen 325 MG tablet Commonly known as: TYLENOL Take 2 tablets (650 mg total) by mouth every 6 (six) hours as needed for mild pain, fever or headache (or Fever >/= 101).   cephALEXin 500 MG capsule Commonly known as: Keflex Take 1 capsule (500 mg total) by mouth 3 (three) times daily for 3 days.   dexamethasone 0.5 MG/5ML solution Commonly known as: DECADRON Take 4 mg by mouth daily as needed.   fludrocortisone 0.1 MG tablet Commonly known as: FLORINEF Take 0.1 mg by mouth daily.   hydrocortisone 20 MG tablet Commonly known as: CORTEF Take 20 mg by mouth daily.   levothyroxine 50 MCG tablet Commonly known as: SYNTHROID Take 50 mcg by mouth. Every other day (not on same day as )   levothyroxine 75 MCG tablet Commonly known as: SYNTHROID Take 75 mcg by mouth. Every other  day (not on the same day as )       Major procedures and Radiology Reports - PLEASE review detailed and final reports for all details, in brief -    No results found.  Micro Results    Recent Results (from the past 240 hour(s))  Urine Culture     Status: Abnormal (Preliminary result)   Collection Time: 06/05/20  8:05 PM   Specimen: Urine, Clean Catch  Result Value Ref Range Status   Specimen Description   Final    URINE, CLEAN CATCH Performed at [facility], [address], [place], [place] [address]    Special Requests   Final    NONE Performed at [facility], [address], [place], [place] [address]    Culture 80,000 COLONIES/mL GRAM NEGATIVE RODS (A)  Final   Report Status PENDING  Incomplete  SARS Coronavirus 2 by RT PCR (hospital order, performed in [facility] hospital lab) Nasopharyngeal Nasopharyngeal Swab     Status: None   Collection Time: 06/05/20 10:00 PM   Specimen: Nasopharyngeal Swab  Result Value Ref Range Status   SARS Coronavirus 2 NEGATIVE NEGATIVE Final    Comment: (NOTE) SARS-CoV-2 target nucleic acids are NOT DETECTED.  The SARS-CoV-2 RNA is generally detectable in upper and lower respiratory specimens during the acute phase of infection. The lowest concentration of SARS-CoV-2 viral copies this assay can detect is 250 copies / mL. A negative result does not preclude SARS-CoV-2 infection and should not be used as the sole basis for treatment or other patient management decisions.  A negative result may occur with improper specimen collection / handling, submission of specimen other than nasopharyngeal swab, presence of viral mutation(s) within the areas targeted by this assay, and inadequate number of viral copies (<250 copies / mL). A negative result must be combined with clinical observations, patient history, and epidemiological information.  Fact Sheet for Patients:   BoilerBrush.com.cy  Fact Sheet for  Healthcare Providers: https://pope.com/  This test is not yet approved or  cleared by the [place] FDA and has been authorized for detection and/or diagnosis of SARS-CoV-2 by FDA under an Emergency Use Authorization (EUA).  This EUA will remain in effect (meaning this test can be used) for the duration of the COVID-19 declaration under Section 564(b)(1) of the Act, 21 U.S.C. section 360bbb-3(b)(1), unless the authorization is terminated or revoked sooner.  Performed at [facility], [address] [place], [place] [address]        Today   Subjective    Neela Zecca today has no new complaints No fever  Or chills  No further vomiting or diarrhea -orthoStatic vitals noted          Patient has been seen and examined prior to discharge   Objective   Blood pressure (!) 146/59, pulse 100, temperature 98.2 F (36.8 C), resp. rate 18, height 5' (1.524 m), weight 54.4 kg, SpO2 98 %.   Intake/Output Summary (Last 24 hours) at 06/07/2020 0958 Last data filed at 06/07/2020 0417 Gross per 24 hour  Intake 2673.61 ml  Output 800 ml  Net 1873.61 ml    Exam Gen:- Awake Alert, no acute distress  HEENT:- [place].AT, No sclera icterus Neck-Supple Neck,No JVD,.  Lungs-  CTAB , good air movement bilaterally  CV- S1, S2 normal, regular Abd-  +ve B.Sounds, Abd Soft, No tenderness, no CVA area tenderness, no suprapubic area tenderness Extremity/Skin:- No  edema,   good pulses Psych-affect is appropriate, oriented x3 Neuro-no new focal deficits, no tremors    Data Review   CBC w Diff:  Lab Results  Component Value Date   WBC 4.3 06/06/2020   HGB 12.4 06/06/2020   HCT 39.5 06/06/2020   PLT 153 06/06/2020   LYMPHOPCT 11 06/05/2020   MONOPCT 8 06/05/2020   EOSPCT 2 06/05/2020   BASOPCT 0 06/05/2020    CMP:  Lab Results  Component Value Date   NA 134 (L) 06/06/2020   K 4.3 06/06/2020   CL 107 06/06/2020   CO2 18 (L) 06/06/2020   BUN 30 (H)  06/06/2020   CREATININE 0.98 06/06/2020   PROT 7.7 06/05/2020   ALBUMIN 4.2 06/05/2020   BILITOT 0.9 06/05/2020   ALKPHOS 57 06/05/2020   AST 34 06/05/2020   ALT 27 06/05/2020  .   Total Discharge time is about 33 minutes  Shon Hale M.D on 06/07/2020 at 9:58 AM  Go to [email or web address] -  for contact info  [facility] - Office  [phone]

## 2020-06-07 NOTE — Progress Notes (Signed)
Discharge instructions reviewed with patient and patient daughter.  Both verbalized understanding of instructions.  Patient discharged home with family in stable condition.   

## 2020-06-07 NOTE — Discharge Instructions (Signed)
1)Your addisonian crisis appears to have resolved--please resume your previous doses of hydrocortisone and Florinef without any changes 2)Please follow-up with your endocrinologist Dr. West Pugh in [facility]  for management of your thyroid disorder and Addison's disease 3) please take Keflex antibiotic as prescribed for UTI

## 2020-06-08 LAB — URINE CULTURE: Culture: 80000 — AB

## 2021-02-19 ENCOUNTER — Other Ambulatory Visit: Payer: Self-pay

## 2021-02-19 ENCOUNTER — Encounter (HOSPITAL_COMMUNITY): Payer: Self-pay | Admitting: Emergency Medicine

## 2021-02-19 DIAGNOSIS — S82852A Displaced trimalleolar fracture of left lower leg, initial encounter for closed fracture: Secondary | ICD-10-CM | POA: Insufficient documentation

## 2021-02-19 DIAGNOSIS — E039 Hypothyroidism, unspecified: Secondary | ICD-10-CM | POA: Diagnosis not present

## 2021-02-19 DIAGNOSIS — Z87891 Personal history of nicotine dependence: Secondary | ICD-10-CM | POA: Diagnosis not present

## 2021-02-19 DIAGNOSIS — W108XXA Fall (on) (from) other stairs and steps, initial encounter: Secondary | ICD-10-CM | POA: Diagnosis not present

## 2021-02-19 DIAGNOSIS — Z79899 Other long term (current) drug therapy: Secondary | ICD-10-CM | POA: Diagnosis not present

## 2021-02-19 DIAGNOSIS — S99912A Unspecified injury of left ankle, initial encounter: Secondary | ICD-10-CM | POA: Diagnosis present

## 2021-02-19 NOTE — ED Triage Notes (Signed)
Pt had mechanical fall tonight at home. Pt c/o left ankle pain since the fall.

## 2021-02-20 ENCOUNTER — Emergency Department (HOSPITAL_COMMUNITY): Payer: Medicare Other

## 2021-02-20 ENCOUNTER — Emergency Department (HOSPITAL_COMMUNITY)
Admission: EM | Admit: 2021-02-20 | Discharge: 2021-02-20 | Disposition: A | Discharge status: Home / Self Care | Payer: Medicare Other | Attending: Emergency Medicine | Admitting: Emergency Medicine

## 2021-02-20 DIAGNOSIS — S82852A Displaced trimalleolar fracture of left lower leg, initial encounter for closed fracture: Secondary | ICD-10-CM | POA: Diagnosis not present

## 2021-02-20 DIAGNOSIS — W19XXXA Unspecified fall, initial encounter: Secondary | ICD-10-CM

## 2021-02-20 LAB — CBC WITH DIFFERENTIAL/PLATELET
Abs Immature Granulocytes: 0.04 10*3/uL (ref 0.00–0.07)
Basophils Absolute: 0.1 10*3/uL (ref 0.0–0.1)
Basophils Relative: 1 %
Eosinophils Absolute: 0.1 10*3/uL (ref 0.0–0.5)
Eosinophils Relative: 1 %
HCT: 39.2 % (ref 36.0–46.0)
Hemoglobin: 12.2 g/dL (ref 12.0–15.0)
Immature Granulocytes: 0 %
Lymphocytes Relative: 9 %
Lymphs Abs: 1 10*3/uL (ref 0.7–4.0)
MCH: 27.5 pg (ref 26.0–34.0)
MCHC: 31.1 g/dL (ref 30.0–36.0)
MCV: 88.3 fL (ref 80.0–100.0)
Monocytes Absolute: 0.5 10*3/uL (ref 0.1–1.0)
Monocytes Relative: 4 %
Neutro Abs: 9.6 10*3/uL — ABNORMAL HIGH (ref 1.7–7.7)
Neutrophils Relative %: 85 %
Platelets: 218 10*3/uL (ref 150–400)
RBC: 4.44 MIL/uL (ref 3.87–5.11)
RDW: 13.4 % (ref 11.5–15.5)
WBC: 11.2 10*3/uL — ABNORMAL HIGH (ref 4.0–10.5)
nRBC: 0 % (ref 0.0–0.2)

## 2021-02-20 LAB — BASIC METABOLIC PANEL
Anion gap: 8 (ref 5–15)
BUN: 21 mg/dL (ref 8–23)
CO2: 24 mmol/L (ref 22–32)
Calcium: 8.9 mg/dL (ref 8.9–10.3)
Chloride: 101 mmol/L (ref 98–111)
Creatinine, Ser: 0.97 mg/dL (ref 0.44–1.00)
GFR, Estimated: >60 mL/min (ref >60)
Glucose, Bld: 122 mg/dL — ABNORMAL HIGH (ref 70–99)
Potassium: 4.1 mmol/L (ref 3.5–5.1)
Sodium: 133 mmol/L — ABNORMAL LOW (ref 135–145)

## 2021-02-20 MED ORDER — MIDAZOLAM HCL (PF) 10 MG/2ML IJ SOLN
4.0000 mg | Freq: Once | INTRAMUSCULAR | Status: AC
  Administered 2021-02-20: 4 mg via INTRAVENOUS

## 2021-02-20 MED ORDER — HYDROMORPHONE HCL 1 MG/ML IJ SOLN
INTRAMUSCULAR | Status: AC
  Administered 2021-02-20: 1 mg via INTRAVENOUS
  Filled 2021-02-20: qty 1

## 2021-02-20 MED ORDER — PROPOFOL 10 MG/ML IV BOLUS
0.5000 mg/kg | Freq: Once | INTRAVENOUS | Status: AC
  Administered 2021-02-20: 25 mg via INTRAVENOUS
  Filled 2021-02-20: qty 20

## 2021-02-20 MED ORDER — HYDROMORPHONE HCL 1 MG/ML IJ SOLN: 1.0000 mg | Freq: Once | INTRAMUSCULAR | Status: AC

## 2021-02-20 MED ORDER — ONDANSETRON 4 MG PO TBDP: 4.0000 mg | ORAL_TABLET | Freq: Three times a day (TID) | ORAL | 0 refills | Status: DC | PRN

## 2021-02-20 MED ORDER — OXYCODONE-ACETAMINOPHEN 5-325 MG PO TABS: 1.0000 | ORAL_TABLET | Freq: Four times a day (QID) | ORAL | 0 refills | Status: AC | PRN

## 2021-02-20 MED ORDER — HYDROMORPHONE HCL 1 MG/ML IJ SOLN
1.0000 mg | Freq: Once | INTRAMUSCULAR | Status: AC
  Administered 2021-02-20: 1 mg via INTRAVENOUS
  Filled 2021-02-20: qty 1

## 2021-02-20 MED ORDER — MIDAZOLAM HCL (PF) 10 MG/2ML IJ SOLN
1.0000 mg | Freq: Once | INTRAMUSCULAR | Status: AC
  Administered 2021-02-20: 1 mg via INTRAVENOUS
  Filled 2021-02-20: qty 2

## 2021-02-20 NOTE — Discharge Instructions (Addendum)
Take the pain medication as prescribed.  Keep the leg elevated.  Follow-up with your orthopedic md or Dr. Romeo Apple  this week.  Do not put weight on your left leg. Return to the ED with worsening pain, weakness, numbness, tingling, or any other concerns.

## 2021-02-20 NOTE — ED Notes (Signed)
Pt daughter going home to get a few hours of sleep, informed her that we would monitor pt and let her sleep a while to allow the sedative effects from medication to resolve. Dr Manus Gunning and Pt daughter in agreement with this plan.

## 2021-02-20 NOTE — ED Notes (Signed)
Pt refusing CT of ankle. MD made aware

## 2021-02-20 NOTE — ED Provider Notes (Addendum)
[facility] EMERGENCY DEPARTMENT Provider Note   CSN: [id number] Arrival date & time: 02/19/21  2253     History Chief Complaint  Patient presents with  . Ankle Pain    Alyssa Vasquez is a 75 y.o. female.  Patient with history of Addison's disease with left ankle pain after slipping and falling down 4 stairs in the rain.  Believes she rolled her ankle to the outside.  Did not hit her head or lose consciousness.  No neck or back pain.  Has a skin tear to her left arm and abrasion to her right ankle as well.  Most of her pain is her left ankle diffusely. Pain with moving her toes.  There is no numbness or tingling. No hip or knee pain. No blood thinner use.  No head, neck, back, chest or abdominal pain.  No fever, chills, nausea or vomiting  The history is provided by the patient.  Ankle Pain Associated symptoms: no fever        Past Medical History:  Diagnosis Date  . Addison disease [facility])     Patient Active Problem List   Diagnosis Date Noted  . Addisonian crisis (HCC) 06/05/2020  . Hypothyroidism 06/05/2020  . Hypokalemia 06/05/2020  . Hyponatremia 06/05/2020  . Volume depletion 06/05/2020  . Asymptomatic bacteriuria 06/05/2020    History reviewed. No pertinent surgical history.   OB History   No obstetric history on file.     Family History  Problem Relation Age of Onset  . CAD Brother     Social History   Tobacco Use  . Smoking status: Former Games developer  . Smokeless tobacco: Never Used    Home Medications Prior to Admission medications   Medication Sig Start Date End Date Taking? Authorizing Provider  acetaminophen (TYLENOL) 325 MG tablet Take 2 tablets (650 mg total) by mouth every 6 (six) hours as needed for mild pain, fever or headache (or Fever >/= 101). 06/07/20   Shon Hale, MD  dexamethasone (DECADRON) 0.5 MG/5ML solution Take 4 mg by mouth daily as needed.    [provider]  fludrocortisone (FLORINEF) 0.1 MG tablet Take  0.1 mg by mouth daily.    [provider]  hydrocortisone (CORTEF) 20 MG tablet Take 20 mg by mouth daily.    [provider]  levothyroxine (SYNTHROID) 50 MCG tablet Take 50 mcg by mouth. Every other day (not on same day as )    [provider]  levothyroxine (SYNTHROID) 75 MCG tablet Take 75 mcg by mouth. Every other day (not on the same day as )    [provider]    Allergies    Patient has no known allergies.  Review of Systems   Review of Systems  Constitutional: Negative for activity change, appetite change and fever.  HENT: Negative for congestion and rhinorrhea.   Eyes: Negative for visual disturbance.  Respiratory: Negative for cough, chest tightness and shortness of breath.   Cardiovascular: Negative for chest pain.  Gastrointestinal: Negative for abdominal distention, abdominal pain, nausea and vomiting.  Genitourinary: Negative for dysuria and hematuria.  Musculoskeletal: Positive for arthralgias and myalgias.  Skin: Positive for wound.  Neurological: Negative for dizziness, weakness and headaches.   all other systems are negative except as noted in the HPI and PMH.   Physical Exam Updated Vital Signs BP (!) 160/86 (BP Location: Right Arm)   Pulse 99   Temp 97.9 F (36.6 C) (Oral)   Resp 16   Ht 5' (  1.524 m)   Wt 49.9 kg   SpO2 97%   BMI 21.48 kg/m   Physical Exam Vitals and nursing note reviewed.  Constitutional:      General: She is not in acute distress.    Appearance: She is well-developed.  HENT:     Head: Normocephalic and atraumatic.     Mouth/Throat:     Pharynx: No oropharyngeal exudate.  Eyes:     Conjunctiva/sclera: Conjunctivae normal.     Pupils: Pupils are equal, round, and reactive to light.  Neck:     Comments: No C spine tenderness Cardiovascular:     Rate and Rhythm: Normal rate and regular rhythm.     Heart sounds: Normal heart sounds. No murmur heard.   Pulmonary:     Effort:  Pulmonary effort is normal. No respiratory distress.     Breath sounds: Normal breath sounds.  Abdominal:     Palpations: Abdomen is soft.     Tenderness: There is no abdominal tenderness. There is no guarding or rebound.  Musculoskeletal:        General: Swelling, tenderness and deformity present.     Cervical back: Normal range of motion and neck supple.     Comments: Diffuse swelling and tenderness throughout left ankle with deformity.  Intact DP and PT pulse. Full range of motion of left hip and left knee without pain.  Skin tear left forearm. Abrasion right posterior ankle without bony tenderness FROM R hip and knee without pain.  Skin:    General: Skin is warm.  Neurological:     Mental Status: She is alert and oriented to person, place, and time.     Cranial Nerves: No cranial nerve deficit.     Motor: No abnormal muscle tone.     Coordination: Coordination normal.     Comments:  5/5 strength throughout. CN 2-12 intact.Equal grip strength.   Psychiatric:        Behavior: Behavior normal.     ED Results / Procedures / Treatments   Labs (all labs ordered are listed, but only abnormal results are displayed) Labs Reviewed - No data to display  EKG None  Radiology DG Ankle Complete Left  Result Date: 02/20/2021 CLINICAL DATA:  Post reduction imaging EXAM: LEFT ANKLE COMPLETE - 3+ VIEW COMPARISON:  Same day radiograph FINDINGS: Overlying splinting material obscures fine bony and soft tissue detail. Interval reduction of the multipart ankle fracture with improved alignment. IMPRESSION: Interval reduction of multipart ankle fracture with improved alignment. Electronically Signed   By: Maudry Mayhew MD   On: 02/20/2021 01:58   DG Ankle Complete Left  Result Date: 02/20/2021 CLINICAL DATA:  Left ankle pain after fall EXAM: LEFT ANKLE COMPLETE - 3+ VIEW COMPARISON:  None. FINDINGS: Minimally displaced fractures of the medial malleolus, the posterior tibial plafond, and the  lateral malleolus. Diffuse soft tissue swelling about the ankle. IMPRESSION: Trimalleolar fracture of the left ankle. Electronically Signed   By: Maudry Mayhew MD   On: 02/20/2021 00:26    Procedures Reduction of fracture  Date/Time: 02/20/2021 1:29 AM Performed by: Glynn Octave, MD Authorized by: Glynn Octave, MD  Consent: Written consent obtained. Risks and benefits: risks, benefits and alternatives were discussed Consent given by: patient Patient understanding: patient states understanding of the procedure being performed Patient consent: the patient's understanding of the procedure matches consent given Procedure consent: procedure consent matches procedure scheduled Relevant documents: relevant documents present and verified Test results: test results available and properly labeled  Site marked: the operative site was marked Imaging studies: imaging studies available Patient identity confirmed: hospital-assigned identification number Time out: Immediately prior to procedure a "time out" was called to verify the correct patient, procedure, equipment, support staff and site/side marked as required.  Sedation: Patient sedated: yes Sedation type: moderate (conscious) sedation Sedatives: propofol and midazolam Analgesia: hydromorphone Sedation start date/time: 02/20/2021 1:00 AM Sedation end date/time: 02/20/2021 1:15 AM Vitals: Vital signs were monitored during sedation.  Patient tolerance: patient tolerated the procedure well with no immediate complications  .Sedation  Date/Time: 02/20/2021 1:29 AM Performed by: Glynn Octaveancour, Sumedha Munnerlyn, MD Authorized by: Glynn Octaveancour, Shylin Keizer, MD   Consent:    Consent obtained:  Written   Consent given by:  Patient   Risks discussed:  Nausea, vomiting, respiratory compromise necessitating ventilatory assistance and intubation, prolonged sedation necessitating reversal, prolonged hypoxia resulting in organ damage, allergic reaction, dysrhythmia and  inadequate sedation   Alternatives discussed:  Analgesia without sedation Universal protocol:    Procedure explained and questions answered to patient or proxy's satisfaction: yes     Relevant documents present and verified: yes     Test results available: yes     Imaging studies available: yes     Site/side marked: yes     Immediately prior to procedure, a time out was called: yes   Indications:    Procedure performed:  Fracture reduction   Procedure necessitating sedation performed by:  Different physician Pre-sedation assessment:    Time since last food or drink:  6   NPO status caution: unable to specify NPO status     ASA classification: class 2 - patient with mild systemic disease     Mouth opening:  3 or more finger widths   Thyromental distance:  4 finger widths   Mallampati score:  I - soft palate, uvula, fauces, pillars visible   Neck mobility: normal     Pre-sedation assessments completed and reviewed: airway patency, cardiovascular function, hydration status, mental status, nausea/vomiting, pain level, respiratory function and temperature     Pre-sedation assessment completed:  02/20/2021 1:00 AM Immediate pre-procedure details:    Reassessment: Patient reassessed immediately prior to procedure     Reviewed: vital signs, relevant labs/tests and NPO status     Verified: bag valve mask available, emergency equipment available, intubation equipment available, IV patency confirmed, oxygen available, reversal medications available and suction available   Procedure details (see MAR for exact dosages):    Preoxygenation:  Nasal cannula   Sedation:  Midazolam and propofol   Intended level of sedation: deep   Analgesia:  Hydromorphone   Intra-procedure monitoring:  Blood pressure monitoring, cardiac monitor, continuous pulse oximetry, continuous capnometry, frequent vital sign checks and frequent LOC assessments   Intra-procedure events: none     Total Provider sedation time  (minutes):  15 Post-procedure details:    Post-sedation assessment completed:  02/20/2021 1:30 AM   Attendance: Constant attendance by certified staff until patient recovered     Recovery: Patient returned to pre-procedure baseline     Post-sedation assessments completed and reviewed: airway patency, cardiovascular function, hydration status, mental status, nausea/vomiting, pain level, respiratory function and temperature     Procedure completion:  Tolerated well, no immediate complications     Medications Ordered in ED Medications  HYDROmorphone (DILAUDID) injection 1 mg (has no administration in time range)  midazolam PF (VERSED) injection 1 mg (has no administration in time range)    ED Course  I have reviewed the triage vital signs and  the nursing notes.  Pertinent labs & imaging results that were available during my care of the patient were reviewed by me and considered in my medical decision making (see chart for details).    MDM Rules/Calculators/A&P                         Fall with left ankle fracture dislocation.  No head injury.  No loss of consciousness.  No blood thinner use.  X-ray shows trimalleolar fracture dislocation of left ankle. Recommended xrays of pelvis, R ankle, L forearm but patient declines and states those areas are fine and her L ankle is the only problem.   Discussed with Dr. Romeo Apple.  He agrees with attempted closed reduction.  Patient unable to tolerate reduction with Versed and Dilaudid.  Conscious sedation performed with propofol.  Reduced as above with improved alignment.  Remains neurovascularly intact.  Patient remains somnolent after sedation will need to be more awake before she be safely discharged. Care to be transferred at shift change.   Final Clinical Impression(s) / ED Diagnoses Final diagnoses:  Trimalleolar fracture of ankle, closed, left, initial encounter    Rx / DC Orders ED Discharge Orders    None       Buford Bremer,  Jeannett Senior, MD 02/20/21 3614    Glynn Octave, MD 02/20/21 [phone]

## 2021-02-20 NOTE — ED Notes (Signed)
Dr. Rancour at bedside. 

## 2021-02-20 NOTE — ED Notes (Signed)
Daughter at bedside. Pt resting, vitals WNL, pt arousable when calling name.

## 2021-02-21 ENCOUNTER — Ambulatory Visit: Payer: Self-pay | Admitting: Orthopedic Surgery

## 2021-02-21 ENCOUNTER — Encounter (HOSPITAL_BASED_OUTPATIENT_CLINIC_OR_DEPARTMENT_OTHER): Payer: Self-pay | Admitting: *Deleted

## 2021-02-21 ENCOUNTER — Other Ambulatory Visit: Payer: Self-pay

## 2021-02-21 ENCOUNTER — Other Ambulatory Visit: Payer: Self-pay | Admitting: Orthopedic Surgery

## 2021-02-23 NOTE — Progress Notes (Signed)
Called patient to have patient come in for Pre op MRSA screen and pre op ERAS drink. Spoke with pt's daughter who will try to coordinate patient coming today for lab and drink pickup.

## 2021-02-24 ENCOUNTER — Encounter (HOSPITAL_BASED_OUTPATIENT_CLINIC_OR_DEPARTMENT_OTHER): Payer: Self-pay | Admitting: Orthopedic Surgery

## 2021-02-24 ENCOUNTER — Ambulatory Visit (HOSPITAL_BASED_OUTPATIENT_CLINIC_OR_DEPARTMENT_OTHER): Payer: Medicare Other | Admitting: Certified Registered"

## 2021-02-24 ENCOUNTER — Ambulatory Visit (HOSPITAL_BASED_OUTPATIENT_CLINIC_OR_DEPARTMENT_OTHER)
Admission: RE | Admit: 2021-02-24 | Discharge: 2021-02-24 | Disposition: A | Discharge status: Home / Self Care | Payer: Medicare Other | Attending: Orthopedic Surgery | Admitting: Orthopedic Surgery

## 2021-02-24 ENCOUNTER — Encounter (HOSPITAL_BASED_OUTPATIENT_CLINIC_OR_DEPARTMENT_OTHER)
Admission: RE | Disposition: A | Discharge status: Home / Self Care | Payer: Self-pay | Source: Home / Self Care | Attending: Orthopedic Surgery

## 2021-02-24 ENCOUNTER — Other Ambulatory Visit: Payer: Self-pay

## 2021-02-24 DIAGNOSIS — Z87891 Personal history of nicotine dependence: Secondary | ICD-10-CM | POA: Diagnosis not present

## 2021-02-24 DIAGNOSIS — Z79899 Other long term (current) drug therapy: Secondary | ICD-10-CM | POA: Diagnosis not present

## 2021-02-24 DIAGNOSIS — Z79890 Hormone replacement therapy (postmenopausal): Secondary | ICD-10-CM | POA: Insufficient documentation

## 2021-02-24 DIAGNOSIS — X501XXA Overexertion from prolonged static or awkward postures, initial encounter: Secondary | ICD-10-CM | POA: Diagnosis not present

## 2021-02-24 DIAGNOSIS — S82852A Displaced trimalleolar fracture of left lower leg, initial encounter for closed fracture: Secondary | ICD-10-CM | POA: Diagnosis not present

## 2021-02-24 DIAGNOSIS — E271 Primary adrenocortical insufficiency: Secondary | ICD-10-CM | POA: Diagnosis not present

## 2021-02-24 HISTORY — DX: Hypothyroidism, unspecified: E03.9

## 2021-02-24 HISTORY — PX: ORIF ANKLE FRACTURE: SHX5408

## 2021-02-24 SURGERY — OPEN REDUCTION INTERNAL FIXATION (ORIF) ANKLE FRACTURE
Anesthesia: General | Site: Ankle | Laterality: Left

## 2021-02-24 MED ORDER — PROMETHAZINE HCL 25 MG/ML IJ SOLN: 6.2500 mg | INTRAMUSCULAR | Status: DC | PRN

## 2021-02-24 MED ORDER — AMISULPRIDE (ANTIEMETIC) 5 MG/2ML IV SOLN: 10.0000 mg | Freq: Once | INTRAVENOUS | Status: DC | PRN

## 2021-02-24 MED ORDER — LIDOCAINE HCL (CARDIAC) PF 100 MG/5ML IV SOSY
PREFILLED_SYRINGE | INTRAVENOUS | Status: DC | PRN
  Administered 2021-02-24: 30 mg via INTRAVENOUS

## 2021-02-24 MED ORDER — OXYCODONE HCL 5 MG PO TABS: 5.0000 mg | ORAL_TABLET | Freq: Four times a day (QID) | ORAL | 0 refills | Status: AC | PRN

## 2021-02-24 MED ORDER — FENTANYL CITRATE (PF) 100 MCG/2ML IJ SOLN
INTRAMUSCULAR | Status: AC
  Filled 2021-02-24: qty 2

## 2021-02-24 MED ORDER — HYDROMORPHONE HCL 1 MG/ML IJ SOLN: 0.2500 mg | INTRAMUSCULAR | Status: DC | PRN

## 2021-02-24 MED ORDER — VANCOMYCIN HCL 500 MG IV SOLR
INTRAVENOUS | Status: DC | PRN
  Administered 2021-02-24: 500 mg via TOPICAL

## 2021-02-24 MED ORDER — 0.9 % SODIUM CHLORIDE (POUR BTL) OPTIME
TOPICAL | Status: DC | PRN
  Administered 2021-02-24: 200 mL

## 2021-02-24 MED ORDER — DEXAMETHASONE SODIUM PHOSPHATE 10 MG/ML IJ SOLN
INTRAMUSCULAR | Status: DC | PRN
  Administered 2021-02-24: 4 mg via INTRAVENOUS

## 2021-02-24 MED ORDER — CHLORHEXIDINE GLUCONATE 4 % EX LIQD: 60.0000 mL | Freq: Once | CUTANEOUS | Status: DC

## 2021-02-24 MED ORDER — CEFAZOLIN SODIUM-DEXTROSE 2-4 GM/100ML-% IV SOLN
INTRAVENOUS | Status: AC
  Filled 2021-02-24: qty 100

## 2021-02-24 MED ORDER — MIDAZOLAM HCL 2 MG/2ML IJ SOLN
INTRAMUSCULAR | Status: AC
  Filled 2021-02-24: qty 2

## 2021-02-24 MED ORDER — ROPIVACAINE HCL 5 MG/ML IJ SOLN
INTRAMUSCULAR | Status: DC | PRN
  Administered 2021-02-24: 50 mL via PERINEURAL

## 2021-02-24 MED ORDER — MIDAZOLAM HCL 2 MG/2ML IJ SOLN
2.0000 mg | Freq: Once | INTRAMUSCULAR | Status: AC
  Administered 2021-02-24: 2 mg via INTRAVENOUS

## 2021-02-24 MED ORDER — HYDROCORTISONE NA SUCCINATE PF 100 MG IJ SOLR
INTRAMUSCULAR | Status: DC | PRN
  Administered 2021-02-24: 100 mg via INTRAVENOUS

## 2021-02-24 MED ORDER — PROPOFOL 500 MG/50ML IV EMUL
INTRAVENOUS | Status: DC | PRN
  Administered 2021-02-24: 25 ug/kg/min via INTRAVENOUS

## 2021-02-24 MED ORDER — PROPOFOL 10 MG/ML IV BOLUS
INTRAVENOUS | Status: DC | PRN
  Administered 2021-02-24: 100 mg via INTRAVENOUS

## 2021-02-24 MED ORDER — CEFAZOLIN SODIUM-DEXTROSE 2-4 GM/100ML-% IV SOLN
2.0000 g | INTRAVENOUS | Status: AC
  Administered 2021-02-24: 2 g via INTRAVENOUS

## 2021-02-24 MED ORDER — OXYCODONE HCL 5 MG/5ML PO SOLN
5.0000 mg | Freq: Once | ORAL | Status: DC | PRN
Start: 2021-02-24 — End: 2021-02-24

## 2021-02-24 MED ORDER — POVIDONE-IODINE 10 % EX SWAB: 2.0000 "application " | Freq: Once | CUTANEOUS | Status: DC

## 2021-02-24 MED ORDER — OXYCODONE HCL 5 MG PO TABS: 5.0000 mg | ORAL_TABLET | Freq: Once | ORAL | Status: DC | PRN

## 2021-02-24 MED ORDER — ONDANSETRON HCL 4 MG PO TABS: 4.0000 mg | ORAL_TABLET | Freq: Every day | ORAL | 1 refills | Status: AC | PRN

## 2021-02-24 MED ORDER — FENTANYL CITRATE (PF) 100 MCG/2ML IJ SOLN
50.0000 ug | Freq: Once | INTRAMUSCULAR | Status: AC
Start: 2021-02-24 — End: 2021-02-24
  Administered 2021-02-24: 50 ug via INTRAVENOUS

## 2021-02-24 MED ORDER — LACTATED RINGERS IV SOLN: INTRAVENOUS | Status: DC

## 2021-02-24 MED ORDER — ONDANSETRON HCL 4 MG/2ML IJ SOLN
INTRAMUSCULAR | Status: DC | PRN
  Administered 2021-02-24: 4 mg via INTRAVENOUS

## 2021-02-24 SURGICAL SUPPLY — 91 items
APL PRP STRL LF DISP 70% ISPRP (MISCELLANEOUS) ×1
BANDAGE ESMARK 6X9 LF (GAUZE/BANDAGES/DRESSINGS) IMPLANT
BIT DRILL 2.5X2.75 QC CALB (BIT) ×2 IMPLANT
BIT DRILL 2.9 CANN QC NONSTRL (BIT) ×2 IMPLANT
BIT DRILL 3.5X5.5 QC CALB (BIT) ×2 IMPLANT
BLADE SURG 15 STRL LF DISP TIS (BLADE) ×2 IMPLANT
BLADE SURG 15 STRL SS (BLADE) ×4
BNDG CMPR 9X4 STRL LF SNTH (GAUZE/BANDAGES/DRESSINGS)
BNDG CMPR 9X6 STRL LF SNTH (GAUZE/BANDAGES/DRESSINGS)
BNDG COHESIVE 4X5 TAN STRL (GAUZE/BANDAGES/DRESSINGS) ×2 IMPLANT
BNDG COHESIVE 6X5 TAN STRL LF (GAUZE/BANDAGES/DRESSINGS) ×2 IMPLANT
BNDG ESMARK 4X9 LF (GAUZE/BANDAGES/DRESSINGS) IMPLANT
BNDG ESMARK 6X9 LF (GAUZE/BANDAGES/DRESSINGS)
CANISTER SUCT 1200ML W/VALVE (MISCELLANEOUS) ×2 IMPLANT
CHLORAPREP W/TINT 26 (MISCELLANEOUS) ×2 IMPLANT
COVER BACK TABLE 60X90IN (DRAPES) ×2 IMPLANT
COVER WAND RF STERILE (DRAPES) IMPLANT
CUFF TOURN SGL QUICK 24 (TOURNIQUET CUFF) ×2
CUFF TOURN SGL QUICK 34 (TOURNIQUET CUFF)
CUFF TRNQT CYL 24X4X16.5-23 (TOURNIQUET CUFF) ×1 IMPLANT
CUFF TRNQT CYL 34X4.125X (TOURNIQUET CUFF) IMPLANT
DECANTER SPIKE VIAL GLASS SM (MISCELLANEOUS) IMPLANT
DRAPE EXTREMITY T 121X128X90 (DISPOSABLE) ×2 IMPLANT
DRAPE OEC MINIVIEW 54X84 (DRAPES) ×2 IMPLANT
DRAPE U-SHAPE 47X51 STRL (DRAPES) ×2 IMPLANT
DRSG MEPITEL 4X7.2 (GAUZE/BANDAGES/DRESSINGS) ×2 IMPLANT
DRSG PAD ABDOMINAL 8X10 ST (GAUZE/BANDAGES/DRESSINGS) ×4 IMPLANT
ELECT REM PT RETURN 9FT ADLT (ELECTROSURGICAL) ×2
ELECTRODE REM PT RTRN 9FT ADLT (ELECTROSURGICAL) ×1 IMPLANT
GAUZE SPONGE 4X4 12PLY STRL (GAUZE/BANDAGES/DRESSINGS) ×2 IMPLANT
GLOVE SRG 8 PF TXTR STRL LF DI (GLOVE) ×2 IMPLANT
GLOVE SURG ENC MOIS LTX SZ8 (GLOVE) ×2 IMPLANT
GLOVE SURG LTX SZ8 (GLOVE) IMPLANT
GLOVE SURG POLYISO LF SZ7 (GLOVE) ×2 IMPLANT
GLOVE SURG POLYISO LF SZ7.5 (GLOVE) ×2 IMPLANT
GLOVE SURG UNDER POLY LF SZ7 (GLOVE) ×2 IMPLANT
GLOVE SURG UNDER POLY LF SZ7.5 (GLOVE) ×2 IMPLANT
GLOVE SURG UNDER POLY LF SZ8 (GLOVE) ×4
GOWN STRL REUS W/ TWL LRG LVL3 (GOWN DISPOSABLE) ×2 IMPLANT
GOWN STRL REUS W/ TWL XL LVL3 (GOWN DISPOSABLE) ×2 IMPLANT
GOWN STRL REUS W/TWL LRG LVL3 (GOWN DISPOSABLE) ×4
GOWN STRL REUS W/TWL XL LVL3 (GOWN DISPOSABLE) ×4
K-WIRE ACE 1.6X6 (WIRE) ×4
KWIRE ACE 1.6X6 (WIRE) ×2 IMPLANT
NEEDLE HYPO 22GX1.5 SAFETY (NEEDLE) IMPLANT
NS IRRIG 1000ML POUR BTL (IV SOLUTION) ×2 IMPLANT
PACK BASIN DAY SURGERY FS (CUSTOM PROCEDURE TRAY) ×2 IMPLANT
PAD CAST 4YDX4 CTTN HI CHSV (CAST SUPPLIES) ×1 IMPLANT
PADDING CAST ABS 4INX4YD NS (CAST SUPPLIES)
PADDING CAST ABS COTTON 4X4 ST (CAST SUPPLIES) IMPLANT
PADDING CAST COTTON 4X4 STRL (CAST SUPPLIES) ×2
PADDING CAST COTTON 6X4 STRL (CAST SUPPLIES) ×2 IMPLANT
PENCIL SMOKE EVACUATOR (MISCELLANEOUS) ×2 IMPLANT
PLATE ACE 100DEG 7HOLE (Plate) ×2 IMPLANT
SANITIZER HAND PURELL 535ML FO (MISCELLANEOUS) ×2 IMPLANT
SCREW ACE CAN 4.0 28M (Screw) ×2 IMPLANT
SCREW ACE CAN 4.0 30M (Screw) ×2 IMPLANT
SCREW ACE CAN 4.0 38M (Screw) ×4 IMPLANT
SCREW ACE CAN 4.0 42M (Screw) ×2 IMPLANT
SCREW CORTICAL 3.5MM  16MM (Screw) ×1 IMPLANT
SCREW CORTICAL 3.5MM  20MM (Screw) ×1 IMPLANT
SCREW CORTICAL 3.5MM 14MM (Screw) ×6 IMPLANT
SCREW CORTICAL 3.5MM 16MM (Screw) ×1 IMPLANT
SCREW CORTICAL 3.5MM 18MM (Screw) ×2 IMPLANT
SCREW CORTICAL 3.5MM 20MM (Screw) ×1 IMPLANT
SCREW CORTICAL 3.5MM 26MM (Screw) ×2 IMPLANT
SCREW NLOCK CANC HEX 4X28 (Screw) ×2 IMPLANT
SCREW NLOCK FT 28X4XSLD NS FIB (Screw) ×1 IMPLANT
SHEET MEDIUM DRAPE 40X70 STRL (DRAPES) ×2 IMPLANT
SLEEVE SCD COMPRESS KNEE MED (STOCKING) ×2 IMPLANT
SPLINT FAST PLASTER 5X30 (CAST SUPPLIES) ×20
SPLINT PLASTER CAST FAST 5X30 (CAST SUPPLIES) ×20 IMPLANT
SPONGE LAP 18X18 RF (DISPOSABLE) ×2 IMPLANT
STOCKINETTE 6  STRL (DRAPES) ×1
STOCKINETTE 6 STRL (DRAPES) ×1 IMPLANT
SUCTION FRAZIER HANDLE 10FR (MISCELLANEOUS) ×1
SUCTION TUBE FRAZIER 10FR DISP (MISCELLANEOUS) ×1 IMPLANT
SUT ETHILON 3 0 PS 1 (SUTURE) ×4 IMPLANT
SUT FIBERWIRE #2 38 T-5 BLUE (SUTURE)
SUT MNCRL AB 3-0 PS2 18 (SUTURE) IMPLANT
SUT VIC AB 0 SH 27 (SUTURE) IMPLANT
SUT VIC AB 2-0 SH 27 (SUTURE) ×4
SUT VIC AB 2-0 SH 27XBRD (SUTURE) ×2 IMPLANT
SUTURE FIBERWR #2 38 T-5 BLUE (SUTURE) IMPLANT
SYR BULB EAR ULCER 3OZ GRN STR (SYRINGE) ×2 IMPLANT
SYR CONTROL 10ML LL (SYRINGE) IMPLANT
TOWEL GREEN STERILE FF (TOWEL DISPOSABLE) ×4 IMPLANT
TUBE CONNECTING 20X1/4 (TUBING) ×2 IMPLANT
UNDERPAD 30X36 HEAVY ABSORB (UNDERPADS AND DIAPERS) ×2 IMPLANT
WASHER FLAT ACE (Orthopedic Implant) ×2 IMPLANT
WASHER PLAIN FLAT ACE NS 3PK (Orthopedic Implant) ×2 IMPLANT

## 2021-02-24 NOTE — Anesthesia Procedure Notes (Signed)
Anesthesia Regional Block: Adductor canal block   Pre-Anesthetic Checklist: ,, timeout performed, Correct Patient, Correct Site, Correct Laterality, Correct Procedure, Correct Position, site marked, Risks and benefits discussed,  Surgical consent,  Pre-op evaluation,  At surgeon's request and post-op pain management  Laterality: Left  Prep: chloraprep       Needles:  Injection technique: Single-shot  Needle Type: Stimiplex     Needle Length: 9cm  Needle Gauge: 21     Additional Needles:   Procedures:,,,, ultrasound used (permanent image in chart),,,,  Narrative:  Start time: 02/24/2021 8:44 AM End time: 02/24/2021 8:49 AM Injection made incrementally with aspirations every 5 mL.  Performed by: Personally  Anesthesiologist: Lowella Curb, MD

## 2021-02-24 NOTE — Anesthesia Procedure Notes (Signed)
Procedure Name: LMA Insertion Date/Time: 02/24/2021 9:17 AM Performed by: Sheryn Bison, CRNA Pre-anesthesia Checklist: Patient identified, Emergency Drugs available, Suction available and Patient being monitored Patient Re-evaluated:Patient Re-evaluated prior to induction Oxygen Delivery Method: Circle System Utilized Preoxygenation: Pre-oxygenation with 100% oxygen Induction Type: IV induction Ventilation: Mask ventilation without difficulty LMA: LMA inserted LMA Size: 4.0 Number of attempts: 1 Airway Equipment and Method: bite block Placement Confirmation: positive ETCO2 Tube secured with: Tape Dental Injury: Teeth and Oropharynx as per pre-operative assessment

## 2021-02-24 NOTE — H&P (Signed)
Alyssa Vasquez is an 75 y.o. female.   Chief Complaint: Left ankle pain HPI: The patient is a 75 year old female with a past medical history significant for Addison's disease.  She slipped on some wet stairs at home on Saturday and twisted her left ankle.  She was seen in the emergency department at [facility].  Radiographs revealed a trimalleolar ankle fracture.  She was reduced and splinted.  She presents now for operative treatment of this displaced and unstable left ankle injury.  Past Medical History:  Diagnosis Date  . Addison disease (HCC)   . Hypothyroidism     Past Surgical History:  Procedure Laterality Date  . HEMORROIDECTOMY    . TONSILLECTOMY      Family History  Problem Relation Age of Onset  . CAD Brother    Social History:  reports that she has quit smoking. She has never used smokeless tobacco. She reports that she does not drink alcohol and does not use drugs.  Allergies: No Known Allergies  Medications Prior to Admission  Medication Sig Dispense Refill  . acetaminophen (TYLENOL) 325 MG tablet Take 2 tablets (650 mg total) by mouth every 6 (six) hours as needed for mild pain, fever or headache (or Fever >/= 101). 12 tablet 0  . fludrocortisone (FLORINEF) 0.1 MG tablet Take 0.1 mg by mouth daily.    . hydrocortisone (CORTEF) 20 MG tablet Take 20 mg by mouth daily.    Marland Kitchen levothyroxine (SYNTHROID) 50 MCG tablet Take 50 mcg by mouth. Every other day (not on same day as )    . levothyroxine (SYNTHROID) 75 MCG tablet Take 75 mcg by mouth. Every other day (not on the same day as )    . ondansetron (ZOFRAN ODT) 4 MG disintegrating tablet Take 1 tablet (4 mg total) by mouth every 8 (eight) hours as needed for nausea or vomiting. 20 tablet 0  . oxyCODONE-acetaminophen (PERCOCET/ROXICET) 5-325 MG tablet Take 1 tablet by mouth every 6 (six) hours as needed for severe pain. 10 tablet 0  . dexamethasone (DECADRON) 0.5 MG/5ML solution Take 4 mg by mouth  daily as needed.      No results found for this or any previous visit (from the past 48 hour(s)). No results found.  Review of Systems no recent fever, chills, nausea, vomiting or changes in her appetite  Blood pressure (!) 172/97, temperature 97.7 F (36.5 C), temperature source Oral, resp. rate 14, height 5' (1.524 m), weight 49.9 kg, SpO2 98 %. Physical Exam  Well-nourished well-developed woman in no apparent distress.  Alert and oriented x4.  Normal mood and affect.  Gait is nonweightbearing on the left.  The left ankle is splinted.  The toes have brisk capillary refill.  Skin at the forefoot is healthy and intact.  Active plantar flexion and dorsiflexion strength at the toes.  Normal sensibility to light touch dorsally and plantarly at the toes.   Assessment/Plan Left ankle trimalleolar fracture -to the operating room today for open treatment with internal fixation.  The risks and benefits of the alternative treatment options have been discussed in detail.  The patient wishes to proceed with surgery and specifically understands risks of bleeding, infection, nerve damage, blood clots, need for additional surgery, amputation and death.   Toni Arthurs, MD 02/26/2021, 8:43 AM

## 2021-02-24 NOTE — Anesthesia Preprocedure Evaluation (Signed)
Anesthesia Evaluation  Patient identified by MRN, date of birth, ID band Patient awake    Reviewed: Allergy & Precautions, NPO status , Patient's Chart, lab work & pertinent test results  Airway Mallampati: II  TM Distance: >3 FB Neck ROM: Full    Dental no notable dental hx.    Pulmonary neg pulmonary ROS, former smoker,    Pulmonary exam normal breath sounds clear to auscultation       Cardiovascular negative cardio ROS Normal cardiovascular exam Rhythm:Regular Rate:Normal     Neuro/Psych negative neurological ROS  negative psych ROS   GI/Hepatic negative GI ROS, Neg liver ROS,   Endo/Other  Hypothyroidism   Renal/GU negative Renal ROS  negative genitourinary   Musculoskeletal negative musculoskeletal ROS (+)   Abdominal   Peds negative pediatric ROS (+)  Hematology negative hematology ROS (+)   Anesthesia Other Findings   Reproductive/Obstetrics negative OB ROS                             Anesthesia Physical Anesthesia Plan  ASA: II  Anesthesia Plan: General   Post-op Pain Management:  Regional for Post-op pain   Induction: Intravenous  PONV Risk Score and Plan: 3 and Ondansetron, Dexamethasone, Midazolam and Treatment may vary due to age or medical condition  Airway Management Planned: LMA  Additional Equipment:   Intra-op Plan:   Post-operative Plan: Extubation in OR  Informed Consent: I have reviewed the patients History and Physical, chart, labs and discussed the procedure including the risks, benefits and alternatives for the proposed anesthesia with the patient or authorized representative who has indicated his/her understanding and acceptance.     Dental advisory given  Plan Discussed with: CRNA  Anesthesia Plan Comments:         Anesthesia Quick Evaluation

## 2021-02-24 NOTE — Progress Notes (Signed)
Assisted Dr. Miller with left, ultrasound guided, popliteal, adductor canal block. Side rails up, monitors on throughout procedure. See vital signs in flow sheet. Tolerated Procedure well. 

## 2021-02-24 NOTE — Anesthesia Postprocedure Evaluation (Signed)
Anesthesia Post Note  Patient: Alyssa Vasquez  Procedure(s) Performed: Open Reduction Internal Fixation (ORIF) Trimalleolar Fracture (Left Ankle)     Patient location during evaluation: PACU Anesthesia Type: General Level of consciousness: awake and alert Pain management: pain level controlled Vital Signs Assessment: post-procedure vital signs reviewed and stable Respiratory status: spontaneous breathing, nonlabored ventilation and respiratory function stable Cardiovascular status: blood pressure returned to baseline and stable Postop Assessment: no apparent nausea or vomiting Anesthetic complications: no   No complications documented.  Last Vitals:  Vitals:   02/24/21 1115 02/24/21 1130  BP: (!) 112/57 126/70  Pulse: 77 83  Resp: 17 15  Temp:    SpO2: 100% 94%    Last Pain:  Vitals:   02/24/21 1130  TempSrc:   PainSc: 0-No pain                 Lowella Curb

## 2021-02-24 NOTE — Op Note (Signed)
02/24/2021  11:00 AM  PATIENT:  Alyssa Vasquez  75 y.o. female  PRE-OPERATIVE DIAGNOSIS:  Left ankle trimalleolar fracture  POST-OPERATIVE DIAGNOSIS:  Left ankle trimalleolar fracture  Procedure(s): 1.  Open treatment of left ankle trimalleolar fracture with internal fixation including fixation of the posterior lip 2.  Stress examination of the left ankle under fluoroscopy 3.  AP, lateral and mortise radiographs of the left ankle  SURGEON:  Toni Arthurs, MD  ASSISTANT: None  ANESTHESIA:   General, regional  EBL:  minimal   TOURNIQUET:   Total Tourniquet Time Documented: Thigh (Left) - 73 minutes Total: Thigh (Left) - 73 minutes  COMPLICATIONS:  None apparent  DISPOSITION:  Extubated, awake and stable to recovery.  INDICATION FOR PROCEDURE: The patient is a 75 year old female with a past medical history significant for Addison's disease.  She slipped on wet stairs at her home 5 days ago injuring her left ankle.  Radiographs revealed a left ankle trimalleolar fracture dislocation.  She underwent closed reduction and splinting.  She presents now for operative treatment of this displaced and unstable left ankle injury.  PROCEDURE IN DETAIL: After preoperative consent was obtained and the correct operative site was identified, the patient was brought to the operating room and placed upon the operating table.  General anesthesia was induced.  Preoperative antibiotics were administered.  A surgical timeout was taken.  The left lower extremity was prepped and draped in standard sterile fashion with a tourniquet around the thigh.  The extremity was elevated and the tourniquet was inflated to 250 mmHg.  A posterior lateral incision was made at the ankle.  Dissection was carried sharply down to the subcutaneous tissues taking care to protect branches of the sural nerve.  The interval between the peroneals and the Achilles tendon was developed.  The flexor hallucis longus muscle was  identified.  Lateral to that was the posterior malleolus fracture.  The lateral malleolus fracture was exposed and cleaned of all hematoma.  Hematoma within the posterior malleolus fracture was also irrigated and removed.  The posterior malleolus fracture was reduced and provisionally pinned.  Radiographs confirmed appropriate reduction.  A 4 mm cannulated partially-threaded screw with a washer was placed across the fracture line from posterior to anterior.  It was noted to have excellent purchase and compressed the fracture site appropriately.  A second screw was placed at the apex of the fracture with a washer as a buttress.  This was a 4 mm fully threaded solid screw.  All hardware was from the titanium [employer] small frag set.  The lateral malleolus was then reduced and held provisionally with a tenaculum.  A 3.5 mm lag screw was inserted from posterior to anterior and was noted to have adequate purchase.  A 7 hole one third tubular plate was contoured to fit the lateral malleolus.  It was secured distally with 3 unicortical screws and proximally with 3 bicortical screws.  AP, mortise and lateral radiographs confirmed appropriate reduction of the lateral and posterior malleolus fractures in appropriate position and length of the hardware.  Attention was turned to the medial ankle where a longitudinal incision was made.  Dissection was carried down through the subcutaneous tissues.  Fracture site was identified.  The fracture was reduced and provisionally pinned.  AP and lateral radiographs confirmed appropriate position of both pins.  4 mm partially-threaded cannulated screws were inserted.  Both were noted to have adequate purchase.  Generally the bone was significantly osteopenic.  Final AP, mortise  and lateral radiographs confirmed appropriate position and length of all hardware and appropriate reduction of the medial, lateral and posterior malleolus fractures.  Stress examination was performed with  no instability.  The wounds were irrigated copiously and sprinkled with vancomycin powder.  The subcutaneous tissues were approximated with 2-0 Vicryl.  Skin incisions were closed with 3-0 nylon.  Sterile dressings were applied followed by a well-padded short leg splint.  The tourniquet was released after application of the dressings.  The patient was awakened from anesthesia and transported to the recovery room in stable condition.   FOLLOW UP PLAN: Nonweightbearing on the left lower extremity.  Follow-up in the office in 2 weeks for suture removal and conversion to a short leg cast.  Plan 6 weeks postoperative nonweightbearing immobilization.  Aspirin for DVT prophylaxis.   RADIOGRAPHS: AP, mortise and lateral radiographs of the left ankle are obtained intraoperatively.  These show interval reduction and fixation of the medial, lateral and posterior malleolus fractures.  Hardware is appropriately positioned and of the appropriate lengths.  No other acute injuries are noted.

## 2021-02-24 NOTE — Transfer of Care (Signed)
Immediate Anesthesia Transfer of Care Note  Patient: Alyssa Vasquez  Procedure(s) Performed: Open Reduction Internal Fixation (ORIF) Trimalleolar Fracture (Left Ankle)  Patient Location: PACU  Anesthesia Type:GA combined with regional for post-op pain  Level of Consciousness: drowsy and patient cooperative  Airway & Oxygen Therapy: Patient Spontanous Breathing and Patient connected to face mask oxygen  Post-op Assessment: Report given to RN and Post -op Vital signs reviewed and stable  Post vital signs: Reviewed and stable  Last Vitals:  Vitals Value Taken Time  BP    Temp    Pulse 74 02/24/21 1055  Resp    SpO2 98 % 02/24/21 1055  Vitals shown include unvalidated device data.  Last Pain:  Vitals:   02/24/21 0855  TempSrc:   PainSc: 0-No pain      Patients Stated Pain Goal: 3 (02/24/21 0809)  Complications: No complications documented.

## 2021-02-24 NOTE — Discharge Instructions (Signed)
Toni Arthurs, MD [facility]  Please read the following information regarding your care after surgery.  Medications  You only need a prescription for the narcotic pain medicine (ex. oxycodone, Percocet, Norco).  All of the other medicines listed below are available over the counter. X Aleve 2 pills twice a day for the first 3 days after surgery. X acetominophen (Tylenol) 650 mg every 4-6 hours as you need for minor to moderate pain X oxycodone as prescribed for severe pain  Narcotic pain medicine (ex. oxycodone, Percocet, Vicodin) will cause constipation.  To prevent this problem, take the following medicines while you are taking any pain medicine. X docusate sodium (Colace) 100 mg twice a day X senna (Senokot) 2 tablets twice a day  X To help prevent blood clots, take a baby aspirin (81 mg) once a day for two weeks after surgery.  You should also get up every hour while you are awake to move around.    Weight Bearing ? Bear weight when you are able on your operated leg or foot. ? Bear weight only on your operated foot in the post-op shoe. X Do not bear any weight on the operated leg or foot.  Cast / Splint / Dressing X Keep your splint, cast or dressing clean and dry.  Don't put anything (coat hanger, pencil, etc) down inside of it.  If it gets damp, use a hair dryer on the cool setting to dry it.  If it gets soaked, call the office to schedule an appointment for a cast change. ? Remove your dressing 3 days after surgery and cover the incisions with dry dressings.    After your dressing, cast or splint is removed; you may shower, but do not soak or scrub the wound.  Allow the water to run over it, and then gently pat it dry.  Swelling It is normal for you to have swelling where you had surgery.  To reduce swelling and pain, keep your toes above your nose for at least 3 days after surgery.  It may be necessary to keep your foot or leg elevated for several weeks.  If it hurts, it should be  elevated.  Follow Up Call my office at [phone] when you are discharged from the hospital or surgery center to schedule an appointment to be seen two weeks after surgery.  Call my office at [phone] if you develop a fever >101.5 F, nausea, vomiting, bleeding from the surgical site or severe pain.     Post Anesthesia Home Care Instructions  Activity: Get plenty of rest for the remainder of the day. A responsible individual must stay with you for 24 hours following the procedure.  For the next 24 hours, DO NOT: -Drive a car -Advertising copywriter -Drink alcoholic beverages -Take any medication unless instructed by your physician -Make any legal decisions or sign important papers.  Meals: Start with liquid foods such as gelatin or soup. Progress to regular foods as tolerated. Avoid greasy, spicy, heavy foods. If nausea and/or vomiting occur, drink only clear liquids until the nausea and/or vomiting subsides. Call your physician if vomiting continues.  Special Instructions/Symptoms: Your throat may feel dry or sore from the anesthesia or the breathing tube placed in your throat during surgery. If this causes discomfort, gargle with warm salt water. The discomfort should disappear within 24 hours.  If you had a scopolamine patch placed behind your ear for the management of post- operative nausea and/or vomiting:  1. The medication in the patch is  effective for 72 hours, after which it should be removed.  Wrap patch in a tissue and discard in the trash. Wash hands thoroughly with soap and water. 2. You may remove the patch earlier than 72 hours if you experience unpleasant side effects which may include dry mouth, dizziness or visual disturbances. 3. Avoid touching the patch. Wash your hands with soap and water after contact with the patch.    Regional Anesthesia Blocks  1. Numbness or the inability to move the "blocked" extremity may last from 3-48 hours after placement. The length  of time depends on the medication injected and your individual response to the medication. If the numbness is not going away after 48 hours, call your surgeon.  2. The extremity that is blocked will need to be protected until the numbness is gone and the  Strength has returned. Because you cannot feel it, you will need to take extra care to avoid injury. Because it may be weak, you may have difficulty moving it or using it. You may not know what position it is in without looking at it while the block is in effect.  3. For blocks in the legs and feet, returning to weight bearing and walking needs to be done carefully. You will need to wait until the numbness is entirely gone and the strength has returned. You should be able to move your leg and foot normally before you try and bear weight or walk. You will need someone to be with you when you first try to ensure you do not fall and possibly risk injury.  4. Bruising and tenderness at the needle site are common side effects and will resolve in a few days.  5. Persistent numbness or new problems with movement should be communicated to the surgeon or the [facility] ([phone])/ [facility] ([phone]). 

## 2021-02-24 NOTE — Anesthesia Procedure Notes (Signed)
Anesthesia Regional Block: Popliteal block   Pre-Anesthetic Checklist: ,, timeout performed, Correct Patient, Correct Site, Correct Laterality, Correct Procedure, Correct Position, site marked, Risks and benefits discussed,  Surgical consent,  Pre-op evaluation,  At surgeon's request and post-op pain management  Laterality: Left  Prep: chloraprep       Needles:  Injection technique: Single-shot  Needle Type: Stimiplex     Needle Length: 9cm  Needle Gauge: 21     Additional Needles:   Procedures:,,,, ultrasound used (permanent image in chart),,,,  Narrative:  Start time: 02/24/2021 8:45 AM End time: 02/24/2021 8:50 AM Injection made incrementally with aspirations every 5 mL.  Performed by: Personally  Anesthesiologist: Lowella Curb, MD

## 2021-02-28 ENCOUNTER — Encounter (HOSPITAL_BASED_OUTPATIENT_CLINIC_OR_DEPARTMENT_OTHER): Payer: Self-pay | Admitting: Orthopedic Surgery

## 2022-04-28 ENCOUNTER — Emergency Department (HOSPITAL_COMMUNITY)
Admission: EM | Admit: 2022-04-28 | Discharge: 2022-04-29 | Disposition: A | Discharge status: Home / Self Care | Payer: Medicare Other | Attending: Emergency Medicine | Admitting: Emergency Medicine

## 2022-04-28 ENCOUNTER — Encounter (HOSPITAL_COMMUNITY): Payer: Self-pay | Admitting: Emergency Medicine

## 2022-04-28 ENCOUNTER — Emergency Department (HOSPITAL_COMMUNITY): Payer: Medicare Other

## 2022-04-28 ENCOUNTER — Other Ambulatory Visit: Payer: Self-pay

## 2022-04-28 DIAGNOSIS — E039 Hypothyroidism, unspecified: Secondary | ICD-10-CM | POA: Diagnosis not present

## 2022-04-28 DIAGNOSIS — N179 Acute kidney failure, unspecified: Secondary | ICD-10-CM | POA: Insufficient documentation

## 2022-04-28 DIAGNOSIS — R0602 Shortness of breath: Secondary | ICD-10-CM | POA: Diagnosis present

## 2022-04-28 DIAGNOSIS — Z79899 Other long term (current) drug therapy: Secondary | ICD-10-CM | POA: Diagnosis not present

## 2022-04-28 DIAGNOSIS — D649 Anemia, unspecified: Secondary | ICD-10-CM | POA: Diagnosis not present

## 2022-04-28 LAB — CBC WITH DIFFERENTIAL/PLATELET
Abs Immature Granulocytes: 0.02 10*3/uL (ref 0.00–0.07)
Basophils Absolute: 0.1 10*3/uL (ref 0.0–0.1)
Basophils Relative: 1 %
Eosinophils Absolute: 0.1 10*3/uL (ref 0.0–0.5)
Eosinophils Relative: 1 %
HCT: 37 % (ref 36.0–46.0)
Hemoglobin: 10.1 g/dL — ABNORMAL LOW (ref 12.0–15.0)
Immature Granulocytes: 0 %
Lymphocytes Relative: 19 %
Lymphs Abs: 1.5 10*3/uL (ref 0.7–4.0)
MCH: 18.3 pg — ABNORMAL LOW (ref 26.0–34.0)
MCHC: 27.3 g/dL — ABNORMAL LOW (ref 30.0–36.0)
MCV: 66.9 fL — ABNORMAL LOW (ref 80.0–100.0)
Monocytes Absolute: 0.6 10*3/uL (ref 0.1–1.0)
Monocytes Relative: 8 %
Neutro Abs: 5.8 10*3/uL (ref 1.7–7.7)
Neutrophils Relative %: 71 %
Platelets: 407 10*3/uL — ABNORMAL HIGH (ref 150–400)
RBC: 5.53 MIL/uL — ABNORMAL HIGH (ref 3.87–5.11)
RDW: 19.7 % — ABNORMAL HIGH (ref 11.5–15.5)
WBC: 8.2 10*3/uL (ref 4.0–10.5)
nRBC: 0 % (ref 0.0–0.2)

## 2022-04-28 LAB — COMPREHENSIVE METABOLIC PANEL
ALT: 14 U/L (ref 0–44)
AST: 20 U/L (ref 15–41)
Albumin: 4.6 g/dL (ref 3.5–5.0)
Alkaline Phosphatase: 60 U/L (ref 38–126)
Anion gap: 11 (ref 5–15)
BUN: 42 mg/dL — ABNORMAL HIGH (ref 8–23)
CO2: 20 mmol/L — ABNORMAL LOW (ref 22–32)
Calcium: 9.4 mg/dL (ref 8.9–10.3)
Chloride: 102 mmol/L (ref 98–111)
Creatinine, Ser: 1.54 mg/dL — ABNORMAL HIGH (ref 0.44–1.00)
GFR, Estimated: 34 mL/min — ABNORMAL LOW (ref >60)
Glucose, Bld: 107 mg/dL — ABNORMAL HIGH (ref 70–99)
Potassium: 5.1 mmol/L (ref 3.5–5.1)
Sodium: 133 mmol/L — ABNORMAL LOW (ref 135–145)
Total Bilirubin: 0.3 mg/dL (ref 0.3–1.2)
Total Protein: 7.6 g/dL (ref 6.5–8.1)

## 2022-04-28 LAB — TROPONIN I (HIGH SENSITIVITY)
Troponin I (High Sensitivity): 5 ng/L (ref <18)
Troponin I (High Sensitivity): 6 ng/L (ref <18)

## 2022-04-28 LAB — BRAIN NATRIURETIC PEPTIDE: B Natriuretic Peptide: 31.9 pg/mL (ref 0.0–100.0)

## 2022-04-28 NOTE — ED Notes (Signed)
Pt states she is refusing vitals until she gets to see a doctor.

## 2022-04-28 NOTE — ED Triage Notes (Signed)
Patient here with complaint of increasingly severe exertional shortness of breath that she notices most when walking up stairs or working. Patient also reports her hands and lips feeling cold when she feels short of breath. Patient currently alert, oriented, speaking in complete sentences, and is in no apparent distress at this time.

## 2022-04-28 NOTE — ED Provider Triage Note (Signed)
Emergency Medicine Provider Triage Evaluation Note  Elisabetta Mishra , a 76 y.o. female  was evaluated in triage.  Pt complains of shortness of breath with exertion.  Patient reports that she has been experiencing this over the last 2 weeks.  Has been getting progressively worse.  Patient reports that she does have some palpitations at times with exertion as well.  Review of Systems  Positive: Exertional shortness of breath Negative: Chest pain, orthopnea, paroxysmal dyspnea, lightheadedness, syncope, blood in stool, melena, hematuria, vaginal bleeding  Physical Exam  BP (!) 172/89 (BP Location: Left Arm)   Pulse (!) 103   Temp (!) 97.2 F (36.2 C) (Oral)   Resp 18   SpO2 100%  Gen:   Awake, no distress   Resp:  Normal effort, to auscultation bilaterally MSK:   Moves extremities without difficulty, no swelling or tenderness to bilateral lower extremities Other:  +2 radial pulse bilaterally  Medical Decision Making  Medically screening exam initiated at 5:06 PM.  Appropriate orders placed.  Gaylyn Lambert Hogle was informed that the remainder of the evaluation will be completed by another provider, this initial triage assessment does not replace that evaluation, and the importance of remaining in the ED until their evaluation is complete.     Haskel Schroeder, [place] 04/28/22 1707

## 2022-04-29 ENCOUNTER — Telehealth: Payer: Self-pay

## 2022-04-29 DIAGNOSIS — N179 Acute kidney failure, unspecified: Secondary | ICD-10-CM | POA: Diagnosis not present

## 2022-04-29 LAB — IRON AND TIBC
Iron: 36 ug/dL (ref 28–170)
Saturation Ratios: 7 % — ABNORMAL LOW (ref 10.4–31.8)
TIBC: 547 ug/dL — ABNORMAL HIGH (ref 250–450)
UIBC: 511 ug/dL

## 2022-04-29 LAB — D-DIMER, QUANTITATIVE: D-Dimer, Quant: 0.3 ug/mL-FEU (ref 0.00–0.50)

## 2022-04-29 LAB — URINALYSIS, ROUTINE W REFLEX MICROSCOPIC
Bilirubin Urine: NEGATIVE
Glucose, UA: NEGATIVE mg/dL
Hgb urine dipstick: NEGATIVE
Ketones, ur: NEGATIVE mg/dL
Leukocytes,Ua: NEGATIVE
Nitrite: NEGATIVE
Protein, ur: NEGATIVE mg/dL
Specific Gravity, Urine: 1.011 (ref 1.005–1.030)
pH: 5 (ref 5.0–8.0)

## 2022-04-29 LAB — RETICULOCYTES
Immature Retic Fract: 20.9 % — ABNORMAL HIGH (ref 2.3–15.9)
RBC.: 5.33 MIL/uL — ABNORMAL HIGH (ref 3.87–5.11)
Retic Count, Absolute: 94.3 10*3/uL (ref 19.0–186.0)
Retic Ct Pct: 1.8 % (ref 0.4–3.1)

## 2022-04-29 LAB — TSH: TSH: 0.389 u[IU]/mL (ref 0.350–4.500)

## 2022-04-29 LAB — VITAMIN B12: Vitamin B-12: 233 pg/mL (ref 180–914)

## 2022-04-29 LAB — FOLATE: Folate: 18.2 ng/mL (ref >5.9)

## 2022-04-29 LAB — CK: Total CK: 42 U/L (ref 38–234)

## 2022-04-29 LAB — FERRITIN: Ferritin: 10 ng/mL — ABNORMAL LOW (ref 11–307)

## 2022-04-29 MED ORDER — SODIUM CHLORIDE 0.9 % IV BOLUS
1000.0000 mL | Freq: Once | INTRAVENOUS | Status: AC
  Administered 2022-04-29: 1000 mL via INTRAVENOUS

## 2022-04-29 NOTE — ED Notes (Signed)
Pt provided with urine specimen cup and educated that a sample is needed

## 2022-04-29 NOTE — ED Provider Notes (Signed)
[facility] EMERGENCY DEPARTMENT Provider Note   CSN: [id number] Arrival date & time: 04/28/22  1645     History Chief Complaint  Patient presents with   Shortness of Breath    Alyssa Vasquez is a 76 y.o. female.  76 year old female with history of Addison's disease on daily hydrocortisone and also hypothyroidism on daily levothyroxine that presents the ER today with shortness of breath as her primary concern.  Patient states that on Thursday she noticed at work that whenever she was pushing carts she got significantly weak all over and very short of breath associated with lightheadedness.  She had some perioral tingling and tingling in her fingertips as well.  She rested and it got better but when she got up and moved around again it returned.  She left work early and drove home she was very weak try to walk up the steps at her house and she pushed the key in the door, she was so weak she could barely pull the key back out. She then collapsed on the cough. Was worried about an Addison's crisis so checked her BP and it was significantly elevated. She took an extra hydrocortisone and drank some fluids. Did a bit better but still had significant diffuse weakness. This improved somewhat over the next 24 hours but definitely not to normal. Had some dyspnea with it but no chest pain. No recent illnesses. No h/o similar issues. She states that she has been checking her vitals intermittently over last 24 hours and her BP has been normal to significantly elevated as has her heart rate. This has not been associated with anything that she notices. Eating/drinking normally. No recent changes in medications.    Shortness of Breath      Home Medications Prior to Admission medications   Medication Sig Start Date End Date Taking? Authorizing Provider  acetaminophen (TYLENOL) 325 MG tablet Take 2 tablets (650 mg total) by mouth every 6 (six) hours as needed for mild pain, fever or  headache (or Fever >/= 101). 06/07/20   Shon Hale, MD  dexamethasone (DECADRON) 0.5 MG/5ML solution Take 4 mg by mouth daily as needed.    [provider]  fludrocortisone (FLORINEF) 0.1 MG tablet Take 0.1 mg by mouth daily.    [provider]  hydrocortisone (CORTEF) 20 MG tablet Take 20 mg by mouth daily.    [provider]  levothyroxine (SYNTHROID) 50 MCG tablet Take 50 mcg by mouth. Every other day (not on same day as )    [provider]  levothyroxine (SYNTHROID) 75 MCG tablet Take 75 mcg by mouth. Every other day (not on the same day as )    [provider]  ondansetron (ZOFRAN ODT) 4 MG disintegrating tablet Take 1 tablet (4 mg total) by mouth every 8 (eight) hours as needed for nausea or vomiting. 02/20/21   Rancour, Jeannett Senior, MD  oxyCODONE-acetaminophen (PERCOCET/ROXICET) 5-325 MG tablet Take 1 tablet by mouth every 6 (six) hours as needed for severe pain. 02/20/21   Glynn Octave, MD      Allergies    Patient has no known allergies.    Review of Systems   Review of Systems  Respiratory:  Positive for shortness of breath.     Physical Exam Updated Vital Signs BP 124/82   Pulse 91   Temp 98 F (36.7 C)   Resp 16   SpO2 100%  Physical Exam Vitals and nursing note reviewed.  Constitutional:  Appearance: She is well-developed.  HENT:     Head: Normocephalic and atraumatic.  Cardiovascular:     Rate and Rhythm: Normal rate and regular rhythm.  Pulmonary:     Effort: No tachypnea or respiratory distress.     Breath sounds: No stridor. No decreased breath sounds or wheezing.  Abdominal:     General: There is no distension.  Musculoskeletal:     Cervical back: Normal range of motion.     Right lower leg: No edema.     Left lower leg: No edema.  Skin:    General: Skin is warm and dry.  Neurological:     Mental Status: She is alert.     ED Results / Procedures / Treatments   Labs (all labs ordered  are listed, but only abnormal results are displayed) Labs Reviewed  COMPREHENSIVE METABOLIC PANEL - Abnormal; Notable for the following components:      Result Value   Sodium 133 (*)    CO2 20 (*)    Glucose, Bld 107 (*)    BUN 42 (*)    Creatinine, Ser 1.54 (*)    GFR, Estimated 34 (*)    All other components within normal limits  CBC WITH DIFFERENTIAL/PLATELET - Abnormal; Notable for the following components:   RBC 5.53 (*)    Hemoglobin 10.1 (*)    MCV 66.9 (*)    MCH 18.3 (*)    MCHC 27.3 (*)    RDW 19.7 (*)    Platelets 407 (*)    All other components within normal limits  URINALYSIS, ROUTINE W REFLEX MICROSCOPIC - Abnormal; Notable for the following components:   Color, Urine STRAW (*)    All other components within normal limits  IRON AND TIBC - Abnormal; Notable for the following components:   TIBC 547 (*)    Saturation Ratios 7 (*)    All other components within normal limits  FERRITIN - Abnormal; Notable for the following components:   Ferritin 10 (*)    All other components within normal limits  RETICULOCYTES - Abnormal; Notable for the following components:   RBC. 5.33 (*)    Immature Retic Fract 20.9 (*)    All other components within normal limits  URINE CULTURE  BRAIN NATRIURETIC PEPTIDE  TSH  CK  VITAMIN B12  FOLATE  D-DIMER, QUANTITATIVE  POC OCCULT BLOOD, ED  TROPONIN I (HIGH SENSITIVITY)  TROPONIN I (HIGH SENSITIVITY)    EKG EKG Interpretation  Date/Time:  Friday April 28 2022 16:53:07 EDT Ventricular Rate:  109 PR Interval:  138 QRS Duration: 66 QT Interval:  304 QTC Calculation: 409 R Axis:   2 Text Interpretation: Sinus tachycardia Otherwise normal ECG When compared with ECG of 05-Jun-2020 15:29, PREVIOUS ECG IS PRESENT Confirmed by Marily Memos [phone]) on 04/29/2022 12:36:06 AM  Radiology DG Chest 2 View  Result Date: 04/28/2022 CLINICAL DATA:  shortness of breath EXAM: CHEST - 2 VIEW COMPARISON:  None Available. FINDINGS: Least small  to moderate volume hiatal hernia with air-fluid level. The heart and mediastinal contours are within normal limits. Aortic calcification. No focal consolidation. No pulmonary edema. No pleural effusion. No pneumothorax. No acute osseous abnormality. IMPRESSION: 1. No active cardiopulmonary disease. 2. At least small to moderate volume hiatal hernia. 3.  Aortic Atherosclerosis (ICD10-I70.0). Electronically Signed   By: Tish Frederickson M.D.   On: 04/28/2022 17:44    Procedures Procedures    Medications Ordered in ED Medications  sodium chloride 0.9 % bolus 1,000  mL (0 mLs Intravenous Stopped 04/29/22 0330)    ED Course/ Medical Decision Making/ A&P                           Medical Decision Making Amount and/or Complexity of Data Reviewed Labs: ordered. Radiology: ordered.   Considered multiple etiologies for patients symptoms to include ACS, PE, Addisons Crisis, hypothyroidism, acute blood loss anemia. However with negative PE, troponins, ecg, D dimer I think those things are unlikely. Does have low hemoglobin and hasn't had a colonoscopy since she was 38 but denies any active hemorrhage, melena, hematochezia or other obvious reasons. Anemia panel done showing low ferritin, possibly iron deficiency? Either way not likely low enough to be symptomatic. Her kidneys show mild AKI with a prerenal pattern, possibly hypovolemia, fluids given. Her K was high normal, Na slightly low it is possible that these were worse when she felt bad earlier in the week however she didn't have the normal symptoms she has with Addisons in the past, however it is possible that she partially treated with the extra dose of hydrocortisone she took could alter the labs a bit. Does not want an extra dose of hydrocortisone here. Does not have a pcp, will engage TOC to follow up for response to oral iron and recheck kidney function. otherwise will fu w/ her endocrinologist as well.     Final Clinical Impression(s) / ED  Diagnoses Final diagnoses:  Anemia, unspecified type  AKI (acute kidney injury) [facility])    Rx / DC Orders ED Discharge Orders     None         Pragya Lofaso, Barbara Cower, MD 04/29/22 [phone]

## 2022-04-29 NOTE — Telephone Encounter (Signed)
Follow up on PCP establishment. Patient would like a [facility] provider. She lives in [place]. She prefers text l texted her cell phone [facility] numbers to call on Monday

## 2022-04-29 NOTE — ED Notes (Signed)
Pt able to ambulate to and from the bathroom without assistance

## 2022-04-30 LAB — URINE CULTURE: Culture: NO GROWTH

## 2022-06-07 IMAGING — DX DG ANKLE COMPLETE 3+V*L*
3 series · 3 of 3 positions shown · non-contrast
Comparison: Same day radiograph

CLINICAL DATA: Post reduction imaging

EXAM:
LEFT ANKLE COMPLETE - 3+ VIEW

[ankle ap]
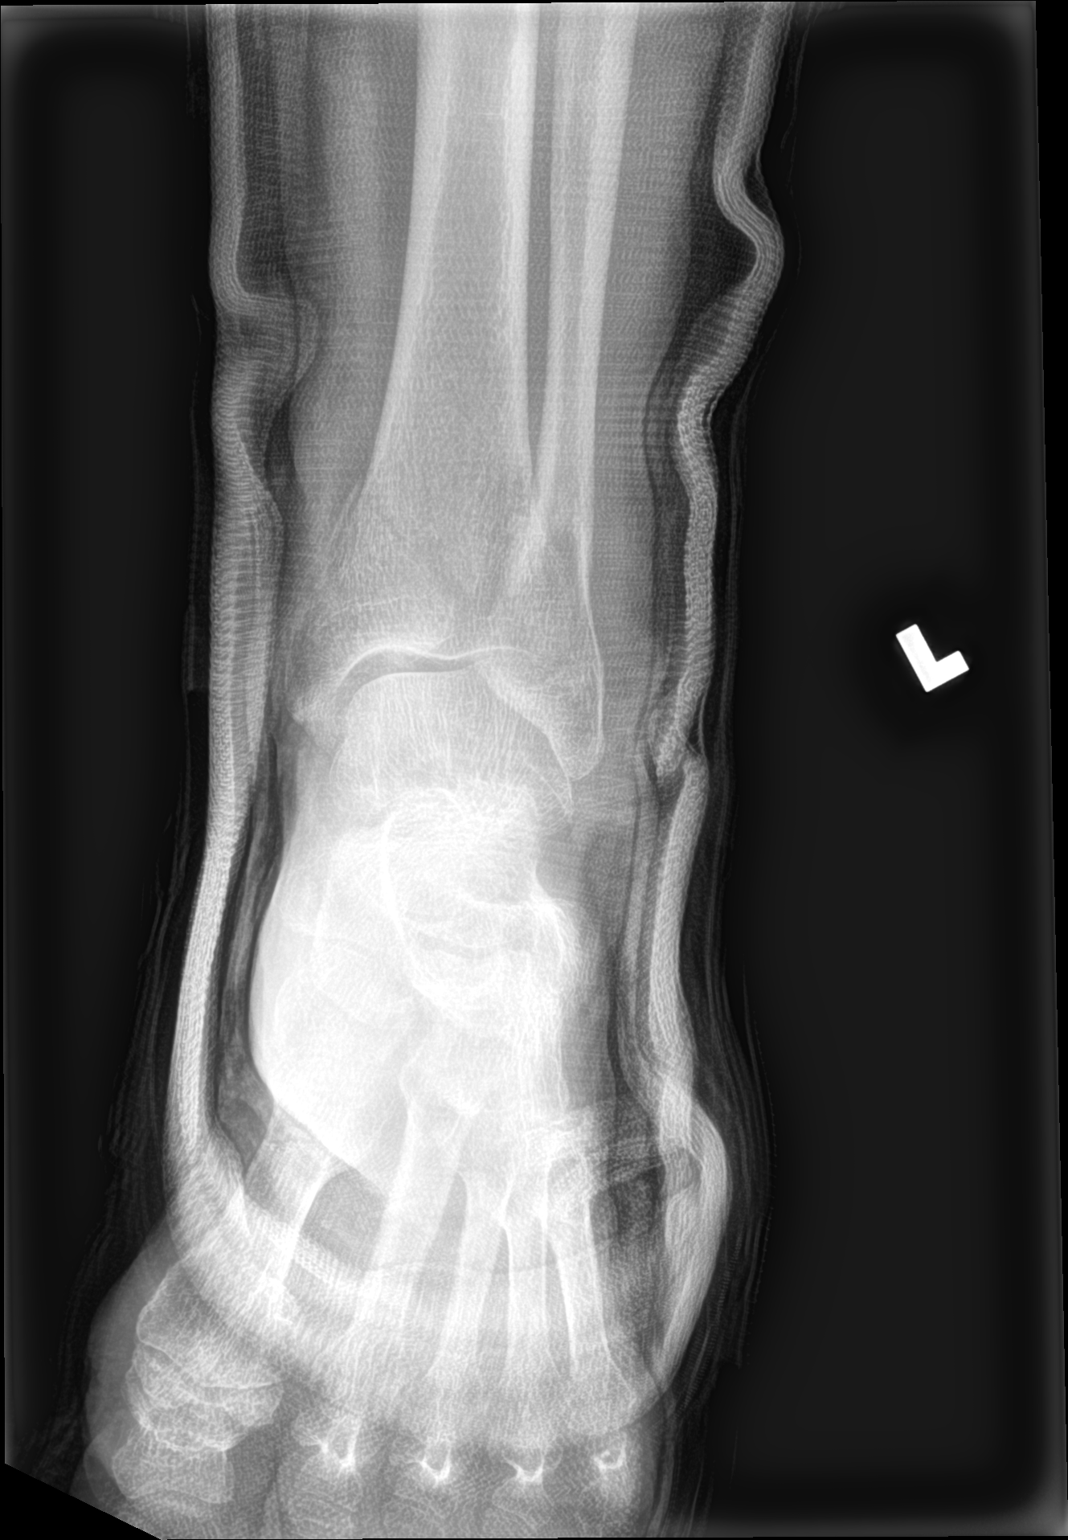

[ankle obl]
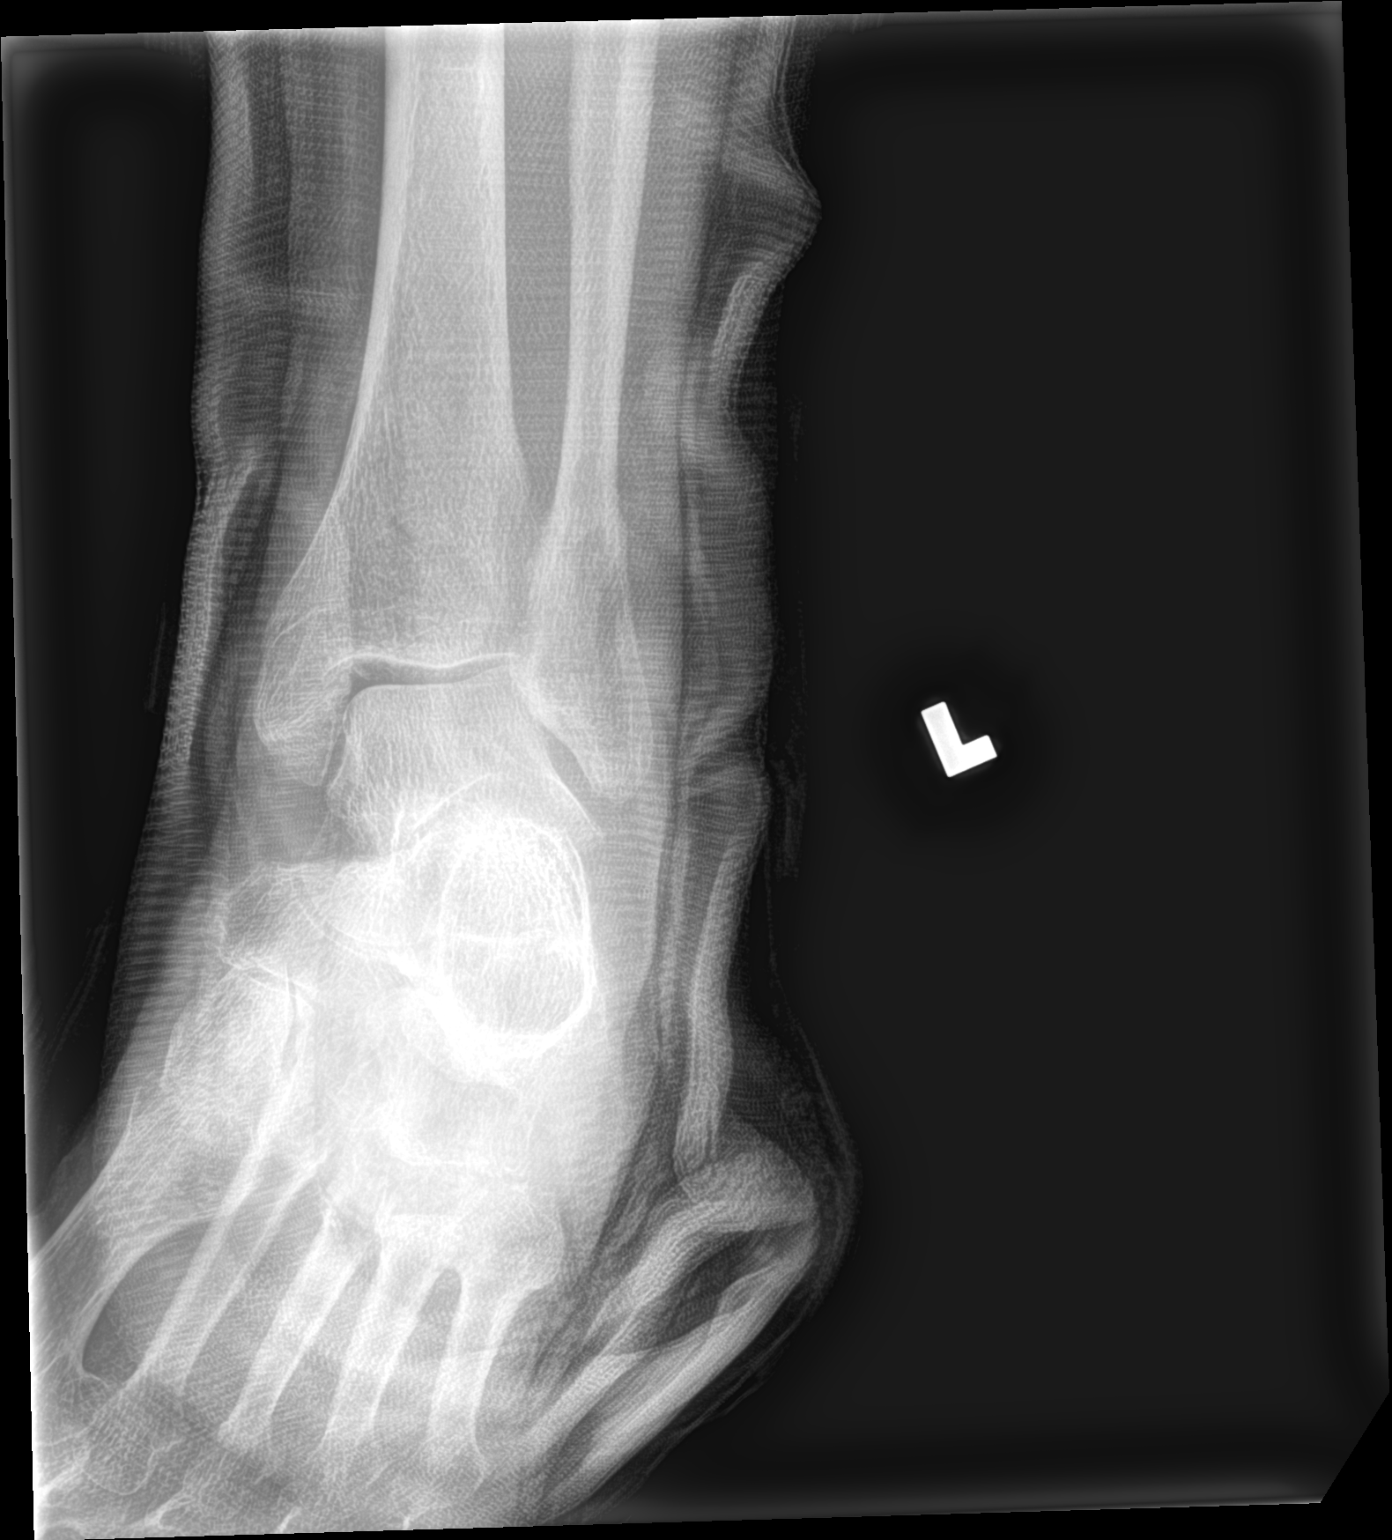

[ankle lat]
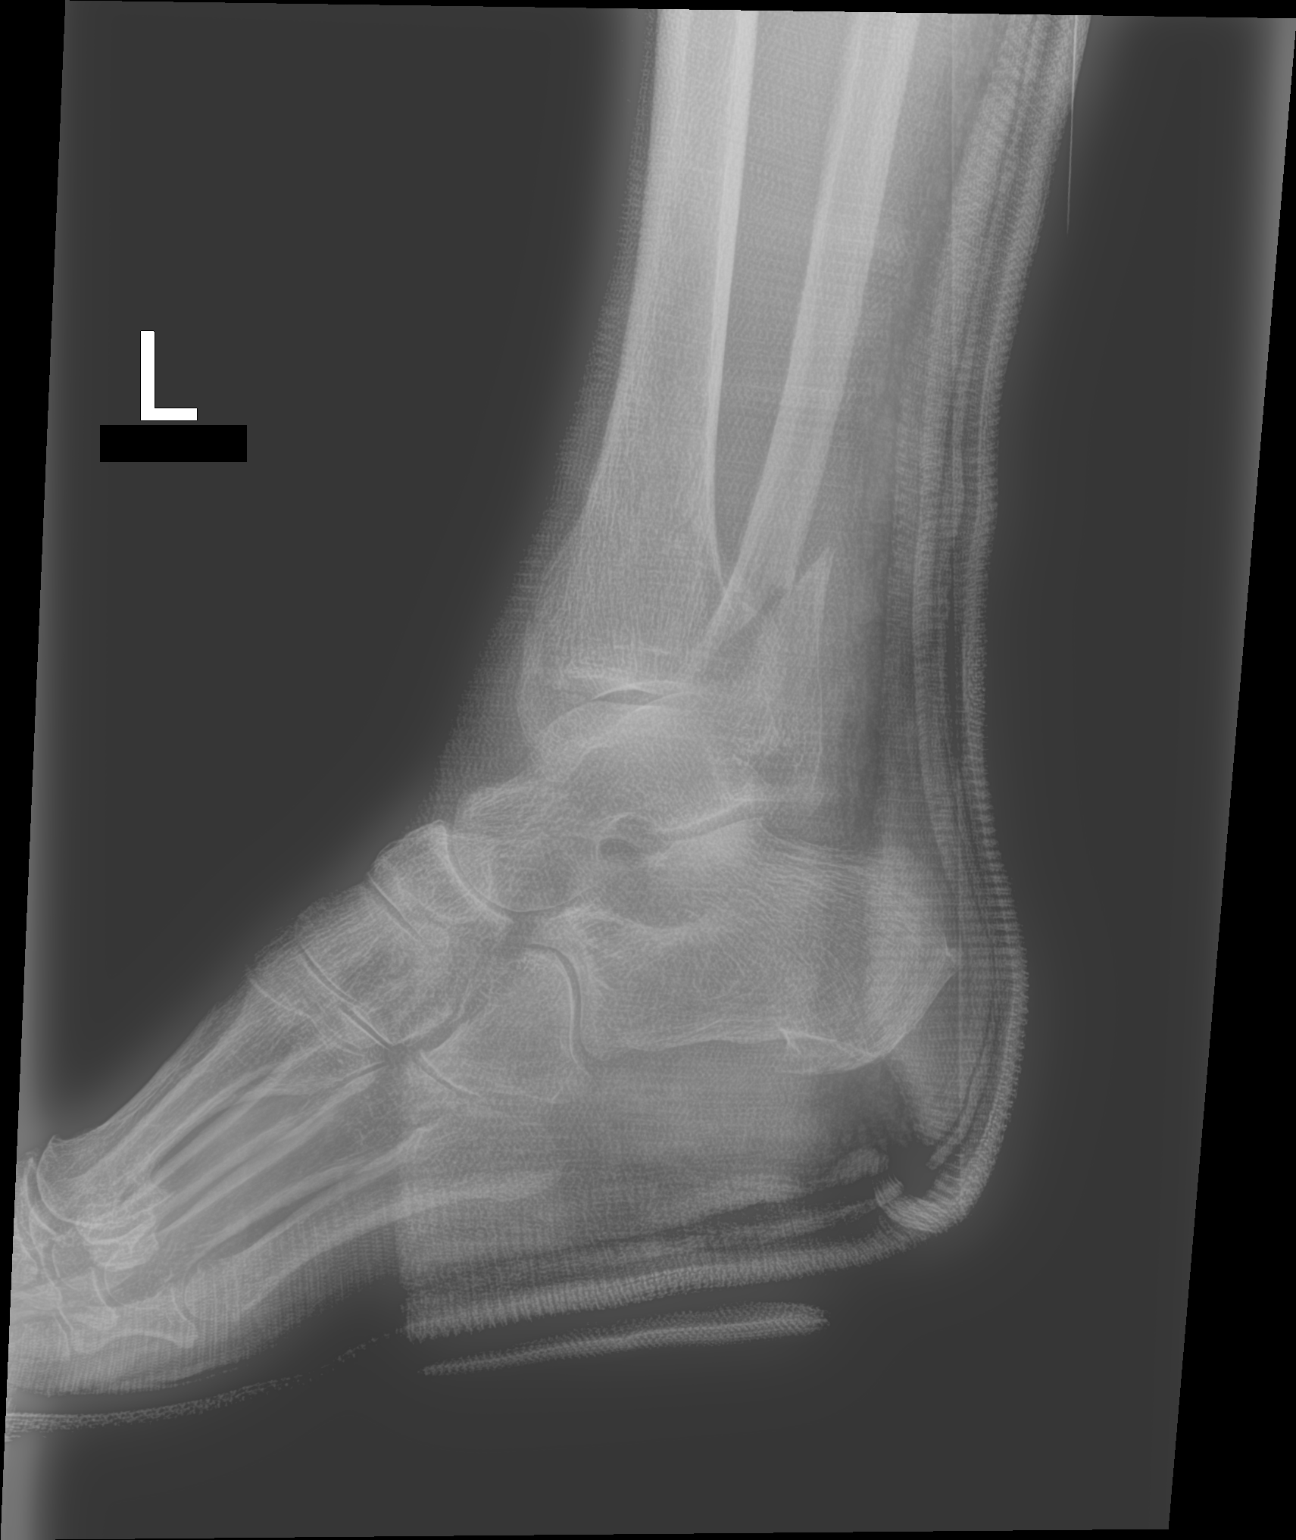

[3 of 3 positions shown; findings below may reference images not displayed]

FINDINGS: Overlying splinting material obscures fine bony and soft tissue
detail. Interval reduction of the multipart ankle fracture with
improved alignment.
IMPRESSION: Interval reduction of multipart ankle fracture with improved
alignment.

## 2022-06-07 IMAGING — CT CT HEAD W/O CM
3 series · 16 of 47 positions shown, 19 images · non-contrast
Comparison: None.

CLINICAL DATA: Head trauma.  Status post fall

EXAM:
CT HEAD WITHOUT CONTRAST
TECHNIQUE: Contiguous axial images were obtained from the base of the skull
through the vertex without intravenous contrast.

[Series 5: head w o · axial · 0.41mm/px · z∈[+39,+164]mm · 10 of 30 slices shown, 13 images]
[im 3/30  brain]
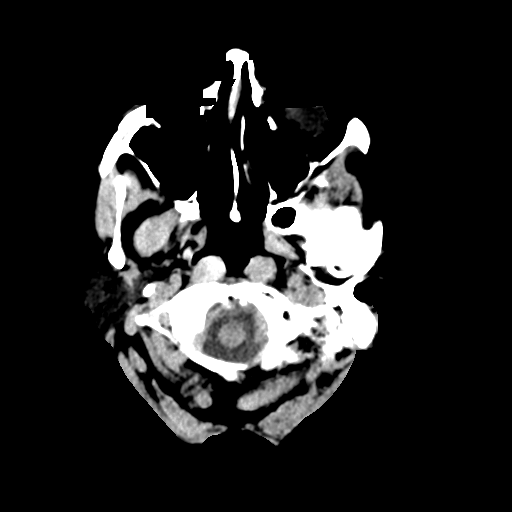
[im 3/30  bone]
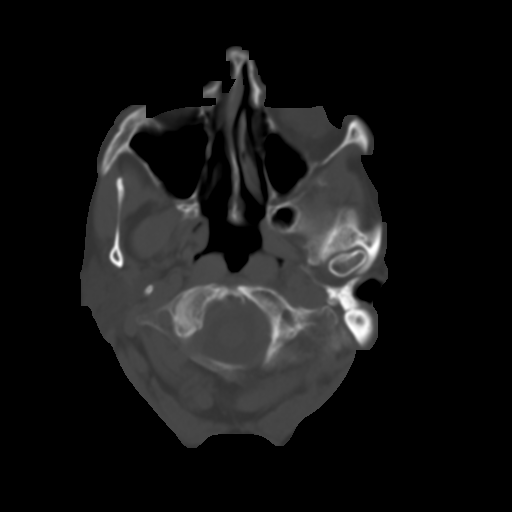
[im 6/30  brain]
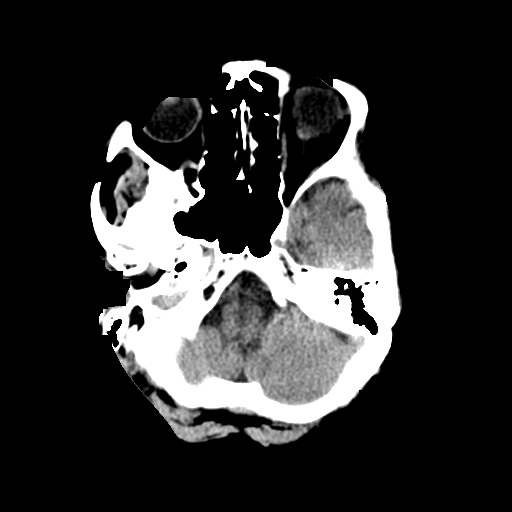
[im 9/30  brain]
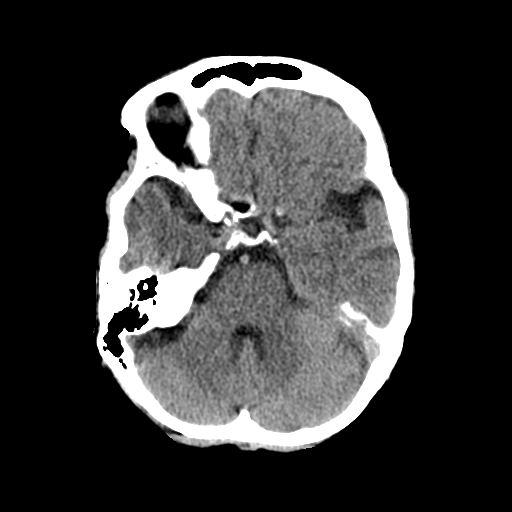
[im 11/30  brain]
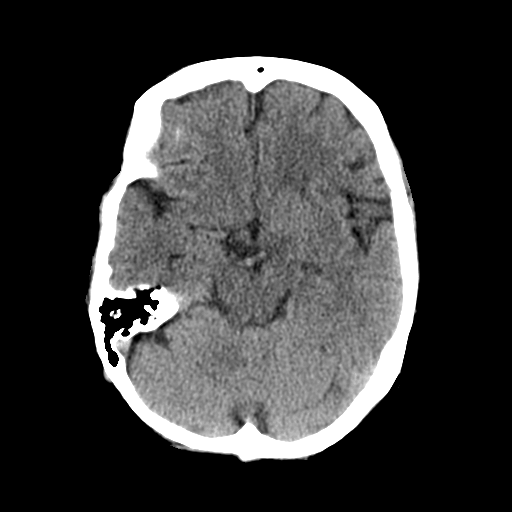
[im 14/30  brain]
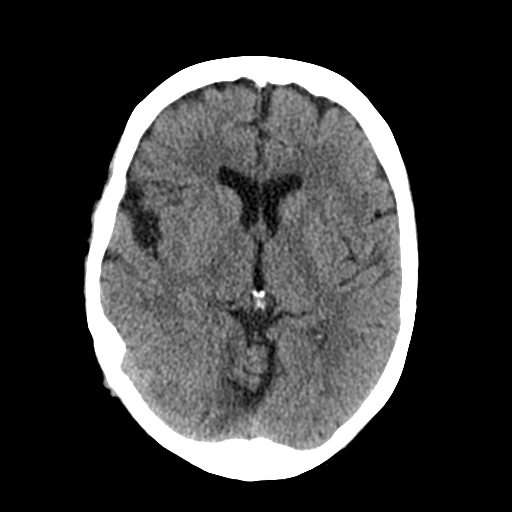
[im 14/30  bone]
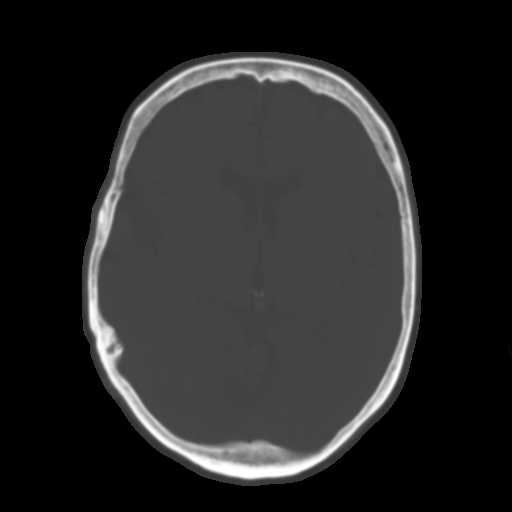
[im 17/30  brain]
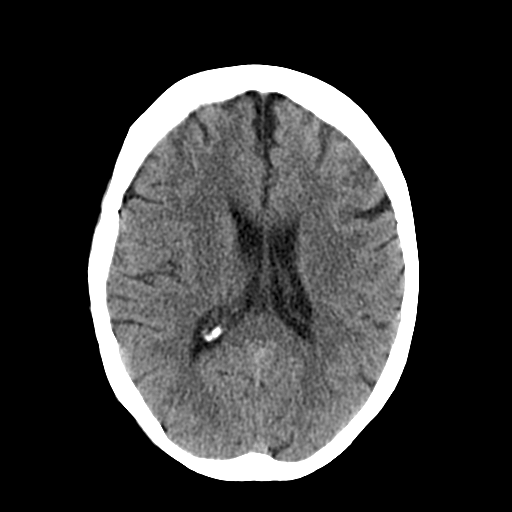
[im 20/30  brain]
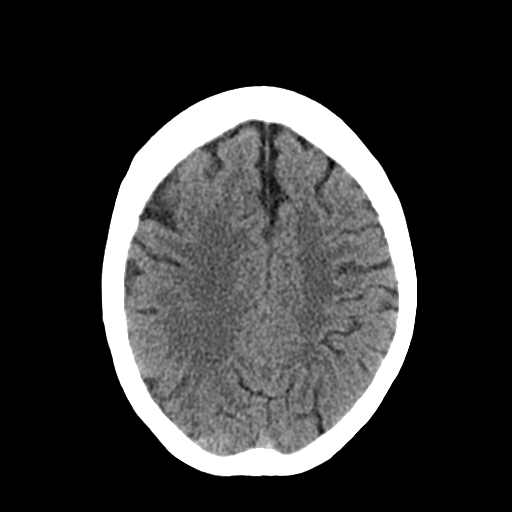
[im 23/30  brain]
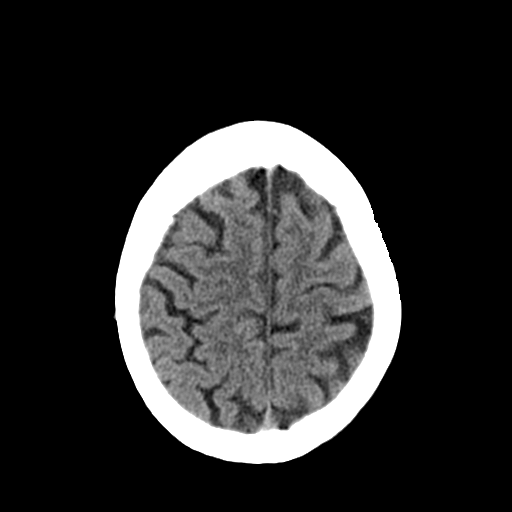
[im 25/30  brain]
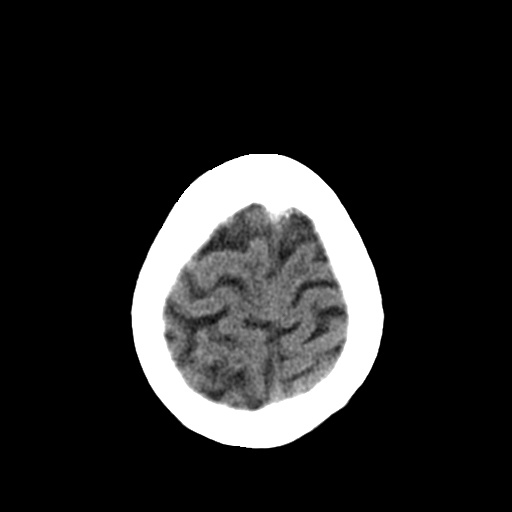
[im 25/30  bone]
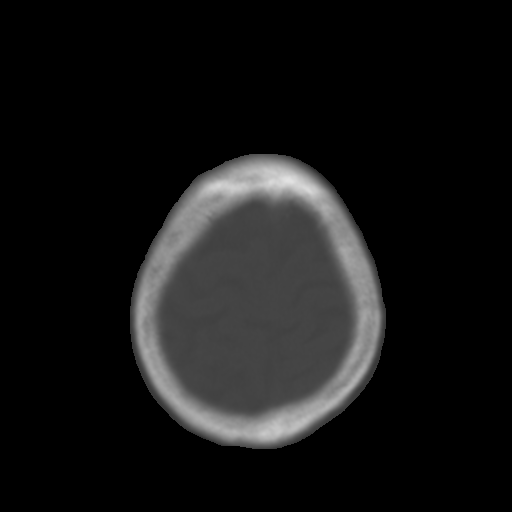
[im 28/30  brain]
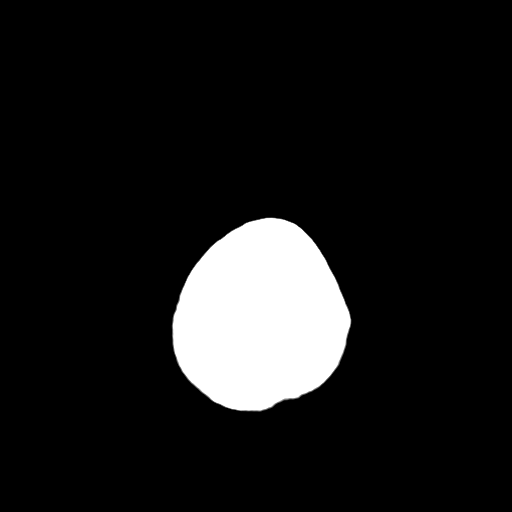

[Series 7: coronal soft · coronal · 0.30mm/px · 3 of 66 slices shown]
[im 22/66  brain]
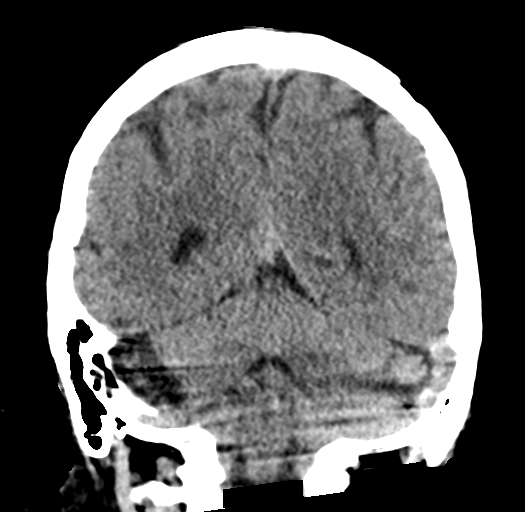
[im 29/66  brain]
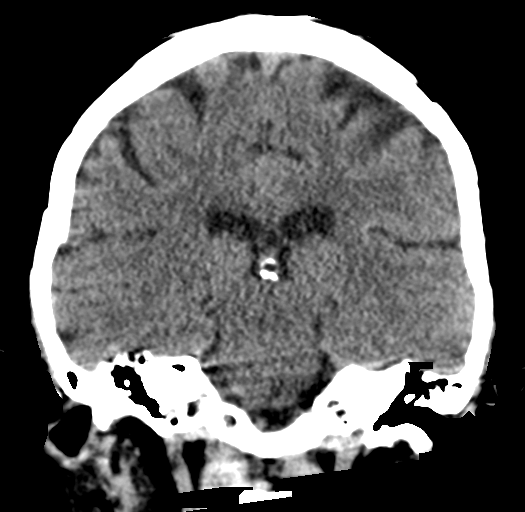
[im 37/66  brain]
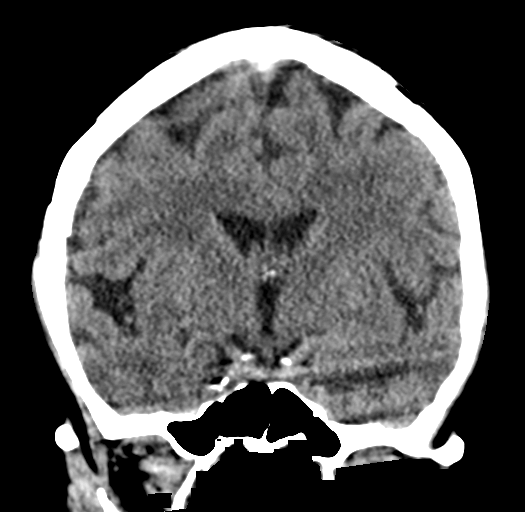

[Series 8: sagittal soft · sagittal · 0.30mm/px · 3 of 51 slices shown]
[im 17/51  brain]
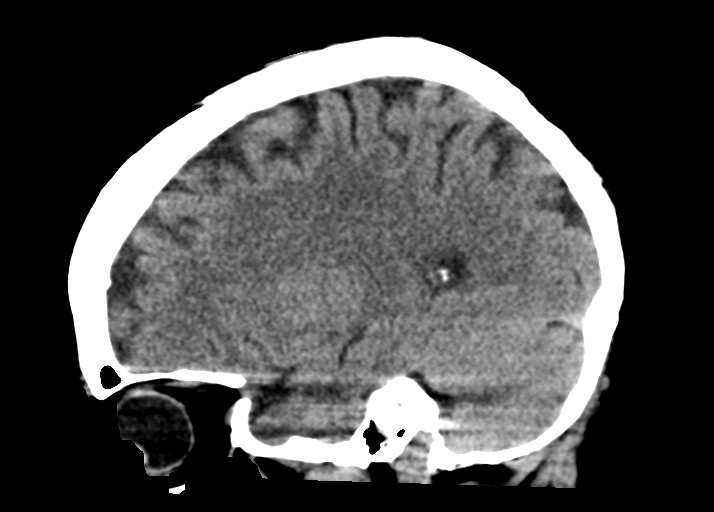
[im 26/51  brain]
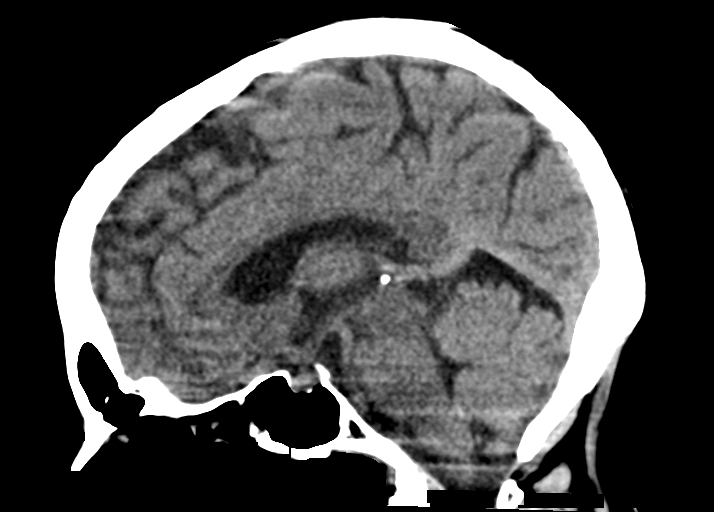
[im 34/51  brain]
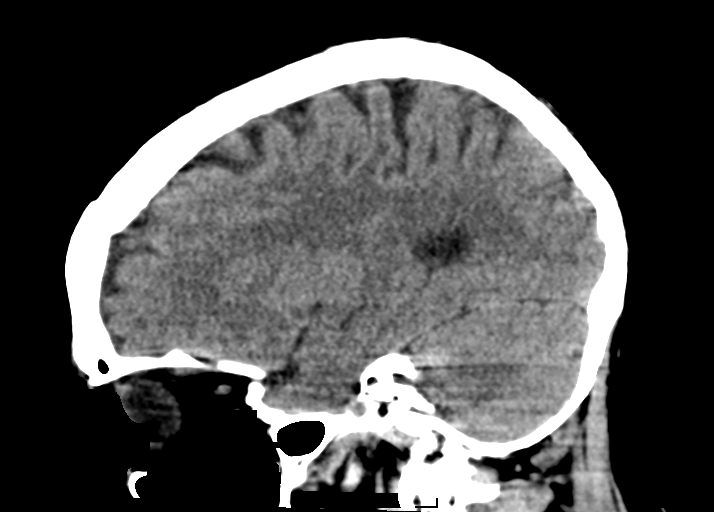

[16 of 47 positions shown; findings below may reference images not displayed]

FINDINGS: Brain: No evidence of acute infarction, hemorrhage, hydrocephalus,
extra-axial collection or mass lesion/mass effect.

Vascular: No hyperdense vessel or unexpected calcification.

Skull: Normal. Negative for fracture or focal lesion.

Sinuses/Orbits: No acute finding.

Other: None.
IMPRESSION: No acute intracranial abnormalities. Normal brain.

## 2024-05-08 ENCOUNTER — Emergency Department (HOSPITAL_COMMUNITY)
Admission: EM | Admit: 2024-05-08 | Discharge: 2024-05-09 | Disposition: A | Discharge status: Home / Self Care | Attending: Emergency Medicine | Admitting: Emergency Medicine

## 2024-05-08 ENCOUNTER — Emergency Department (HOSPITAL_COMMUNITY)

## 2024-05-08 ENCOUNTER — Encounter (HOSPITAL_COMMUNITY): Payer: Self-pay

## 2024-05-08 DIAGNOSIS — Z79899 Other long term (current) drug therapy: Secondary | ICD-10-CM | POA: Insufficient documentation

## 2024-05-08 DIAGNOSIS — R197 Diarrhea, unspecified: Secondary | ICD-10-CM | POA: Insufficient documentation

## 2024-05-08 DIAGNOSIS — R112 Nausea with vomiting, unspecified: Secondary | ICD-10-CM | POA: Insufficient documentation

## 2024-05-08 DIAGNOSIS — R109 Unspecified abdominal pain: Secondary | ICD-10-CM | POA: Insufficient documentation

## 2024-05-08 DIAGNOSIS — Z8639 Personal history of other endocrine, nutritional and metabolic disease: Secondary | ICD-10-CM | POA: Diagnosis not present

## 2024-05-08 DIAGNOSIS — E860 Dehydration: Secondary | ICD-10-CM | POA: Insufficient documentation

## 2024-05-08 DIAGNOSIS — E039 Hypothyroidism, unspecified: Secondary | ICD-10-CM | POA: Diagnosis not present

## 2024-05-08 LAB — CBC WITH DIFFERENTIAL/PLATELET
Abs Granulocyte: 8 K/uL — ABNORMAL HIGH (ref 1.5–6.5)
Abs Immature Granulocytes: 0.03 K/uL (ref 0.00–0.07)
Basophils Absolute: 0 K/uL (ref 0.0–0.1)
Basophils Relative: 0 %
Eosinophils Absolute: 0 K/uL (ref 0.0–0.5)
Eosinophils Relative: 0 %
HCT: 33.6 % — ABNORMAL LOW (ref 36.0–46.0)
Hemoglobin: 9.4 g/dL — ABNORMAL LOW (ref 12.0–15.0)
Immature Granulocytes: 0 %
Lymphocytes Relative: 14 %
Lymphs Abs: 1.4 K/uL (ref 0.7–4.0)
MCH: 19.3 pg — ABNORMAL LOW (ref 26.0–34.0)
MCHC: 28 g/dL — ABNORMAL LOW (ref 30.0–36.0)
MCV: 69.1 fL — ABNORMAL LOW (ref 80.0–100.0)
Monocytes Absolute: 0.6 K/uL (ref 0.1–1.0)
Monocytes Relative: 6 %
Neutro Abs: 8 K/uL — ABNORMAL HIGH (ref 1.7–7.7)
Neutrophils Relative %: 80 %
Platelets: 340 K/uL (ref 150–400)
RBC: 4.86 MIL/uL (ref 3.87–5.11)
RDW: 19.2 % — ABNORMAL HIGH (ref 11.5–15.5)
WBC: 10.1 K/uL (ref 4.0–10.5)
nRBC: 0 % (ref 0.0–0.2)

## 2024-05-08 LAB — TYPE AND SCREEN
ABO/RH(D): A POS
Antibody Screen: NEGATIVE

## 2024-05-08 LAB — COMPREHENSIVE METABOLIC PANEL WITH GFR
ALT: 13 U/L (ref 0–44)
AST: 17 U/L (ref 15–41)
Albumin: 4.2 g/dL (ref 3.5–5.0)
Alkaline Phosphatase: 59 U/L (ref 38–126)
Anion gap: 13 (ref 5–15)
BUN: 26 mg/dL — ABNORMAL HIGH (ref 8–23)
CO2: 21 mmol/L — ABNORMAL LOW (ref 22–32)
Calcium: 9 mg/dL (ref 8.9–10.3)
Chloride: 97 mmol/L — ABNORMAL LOW (ref 98–111)
Creatinine, Ser: 1.11 mg/dL — ABNORMAL HIGH (ref 0.44–1.00)
GFR, Estimated: 50 mL/min — ABNORMAL LOW (ref >60)
Glucose, Bld: 103 mg/dL — ABNORMAL HIGH (ref 70–99)
Potassium: 4.5 mmol/L (ref 3.5–5.1)
Sodium: 131 mmol/L — ABNORMAL LOW (ref 135–145)
Total Bilirubin: 1.3 mg/dL — ABNORMAL HIGH (ref 0.0–1.2)
Total Protein: 7.2 g/dL (ref 6.5–8.1)

## 2024-05-08 LAB — MAGNESIUM: Magnesium: 2.1 mg/dL (ref 1.7–2.4)

## 2024-05-08 MED ORDER — SODIUM CHLORIDE 0.9 % IV BOLUS
1000.0000 mL | Freq: Once | INTRAVENOUS | Status: AC
  Administered 2024-05-08: 1000 mL via INTRAVENOUS

## 2024-05-08 MED ORDER — IOHEXOL 300 MG/ML  SOLN
100.0000 mL | Freq: Once | INTRAMUSCULAR | Status: AC | PRN
  Administered 2024-05-09: 100 mL via INTRAVENOUS

## 2024-05-08 MED ORDER — ONDANSETRON HCL 4 MG/2ML IJ SOLN
4.0000 mg | Freq: Once | INTRAMUSCULAR | Status: AC
  Administered 2024-05-08: 4 mg via INTRAVENOUS
  Filled 2024-05-08: qty 2

## 2024-05-08 NOTE — ED Triage Notes (Signed)
 Pt comes in for n/v/d since last night. Pt has a hx of addisons and anemia.Pt took an extra dose of steroids today. Pt has explosive diarrhea. A&Ox4. Pt is very weak during triage.

## 2024-05-08 NOTE — ED Notes (Signed)
 Patient transported to CT

## 2024-05-08 NOTE — ED Provider Notes (Signed)
 [place] [facility] AT [facility] Provider Note   CSN: [id number] Arrival date & time: 05/08/24  2008     Patient presents with: Nausea, Emesis, and Diarrhea   Alyssa Vasquez is a 78 y.o. female.  {Add pertinent medical, surgical, social history, OB history to HPI:32947} HPI     This is an 78 year old female who presents with nausea, vomiting, diarrhea.  Onset of symptoms yesterday morning.  Patient states that she thought she had a viral illness.  She has a history of Addison's disease and anemia.  This morning she took an extra dose of her oral steroid.  She describes nonbloody diarrhea and nonbloody emesis.  She states that the diarrhea improved throughout the day but she has been generally weak and has not had much to eat.  No fevers.  Reports some crampy abdominal pain.  Prior to Admission medications   Medication Sig Start Date End Date Taking? Authorizing Provider  acetaminophen  (TYLENOL ) 325 MG tablet Take 2 tablets (650 mg total) by mouth every 6 (six) hours as needed for mild pain, fever or headache (or Fever >/= 101). 06/07/20   Pearlean Manus, MD  dexamethasone  (DECADRON ) 0.5 MG/5ML solution Take 4 mg by mouth daily as needed.    [provider]  fludrocortisone  (FLORINEF ) 0.1 MG tablet Take 0.1 mg by mouth daily.    [provider]  hydrocortisone  (CORTEF ) 20 MG tablet Take 20 mg by mouth daily.    [provider]  levothyroxine  (SYNTHROID ) 50 MCG tablet Take 50 mcg by mouth. Every other day (not on same day as 75mcg)    [provider]  levothyroxine  (SYNTHROID ) 75 MCG tablet Take 75 mcg by mouth. Every other day (not on the same day as 50mcg)    [provider]  ondansetron  (ZOFRAN  ODT) 4 MG disintegrating tablet Take 1 tablet (4 mg total) by mouth every 8 (eight) hours as needed for nausea or vomiting. 02/20/21   Rancour, Garnette, MD  oxyCODONE -acetaminophen  (PERCOCET/ROXICET) 5-325 MG tablet Take 1  tablet by mouth every 6 (six) hours as needed for severe pain. 02/20/21   Carita Garnette, MD    Allergies: Patient has no known allergies.    Review of Systems  Constitutional:  Negative for fever.  Respiratory:  Negative for shortness of breath.   Gastrointestinal:  Positive for abdominal pain, diarrhea, nausea and vomiting.  All other systems reviewed and are negative.   Updated Vital Signs BP (!) 147/69 (BP Location: Right Arm)   Pulse 92   Temp 98.3 F (36.8 C) (Tympanic)   Resp 16   Ht 1.524 m (5')   Wt 48.5 kg   SpO2 100%   BMI 20.90 kg/m   Physical Exam Vitals and nursing note reviewed.  Constitutional:      Appearance: She is well-developed. She is not ill-appearing.  HENT:     Head: Normocephalic and atraumatic.     Mouth/Throat:     Mouth: Mucous membranes are dry.  Eyes:     Pupils: Pupils are equal, round, and reactive to light.  Cardiovascular:     Rate and Rhythm: Normal rate and regular rhythm.     Heart sounds: Normal heart sounds.  Pulmonary:     Effort: Pulmonary effort is normal. No respiratory distress.     Breath sounds: No wheezing.  Abdominal:     General: Bowel sounds are normal.     Palpations: Abdomen is soft.     Tenderness: There is abdominal  tenderness. There is no guarding or rebound.  Musculoskeletal:     Cervical back: Neck supple.     Right lower leg: No edema.     Left lower leg: No edema.  Skin:    General: Skin is warm and dry.  Neurological:     Mental Status: She is alert and oriented to person, place, and time.  Psychiatric:        Mood and Affect: Mood normal.     (all labs ordered are listed, but only abnormal results are displayed) Labs Reviewed  CBC WITH DIFFERENTIAL/PLATELET - Abnormal; Notable for the following components:      Result Value   Hemoglobin 9.4 (*)    HCT 33.6 (*)    MCV 69.1 (*)    MCH 19.3 (*)    MCHC 28.0 (*)    RDW 19.2 (*)    Neutro Abs 8.0 (*)    Abs Granulocyte 8.0 (*)    All other  components within normal limits  COMPREHENSIVE METABOLIC PANEL WITH GFR - Abnormal; Notable for the following components:   Sodium 131 (*)    Chloride 97 (*)    CO2 21 (*)    Glucose, Bld 103 (*)    BUN 26 (*)    Creatinine, Ser 1.11 (*)    Total Bilirubin 1.3 (*)    GFR, Estimated 50 (*)    All other components within normal limits  MAGNESIUM  URINALYSIS, ROUTINE W REFLEX MICROSCOPIC  TYPE AND SCREEN    EKG: None  Radiology: No results found.  {Document cardiac monitor, telemetry assessment procedure when appropriate:32947} Procedures   Medications Ordered in the ED  iohexol  (OMNIPAQUE ) 300 MG/ML solution 100 mL (has no administration in time range)  sodium chloride  0.9 % bolus 1,000 mL (1,000 mLs Intravenous New Bag/Given 05/08/24 2338)  ondansetron  (ZOFRAN ) injection 4 mg (4 mg Intravenous Given 05/08/24 2339)      {Click here for ABCD2, HEART and other calculators REFRESH Note before signing:1}                              Medical Decision Making Amount and/or Complexity of Data Reviewed Labs: ordered. Radiology: ordered.  Risk Prescription drug management.   ***  {Document critical care time when appropriate  Document review of labs and clinical decision tools ie CHADS2VASC2, etc  Document your independent review of radiology images and any outside records  Document your discussion with family members, caretakers and with consultants  Document social determinants of health affecting pt's care  Document your decision making why or why not admission, treatments were needed:32947:::1}   Final diagnoses:  None    ED Discharge Orders     None

## 2024-05-09 DIAGNOSIS — R112 Nausea with vomiting, unspecified: Secondary | ICD-10-CM | POA: Diagnosis not present

## 2024-05-09 LAB — URINALYSIS, ROUTINE W REFLEX MICROSCOPIC
Bacteria, UA: NONE SEEN
Bilirubin Urine: NEGATIVE
Glucose, UA: NEGATIVE mg/dL
Hgb urine dipstick: NEGATIVE
Ketones, ur: NEGATIVE mg/dL
Nitrite: NEGATIVE
Protein, ur: NEGATIVE mg/dL
Specific Gravity, Urine: 1.017 (ref 1.005–1.030)
pH: 5 (ref 5.0–8.0)

## 2024-05-09 MED ORDER — ONDANSETRON 4 MG PO TBDP: 4.0000 mg | ORAL_TABLET | Freq: Three times a day (TID) | ORAL | 0 refills | Status: AC | PRN

## 2024-05-09 NOTE — ED Notes (Signed)
 Gave pt ice water and saltines.

## 2024-05-09 NOTE — Discharge Instructions (Signed)
 You were seen today for nausea, vomiting, diarrhea.  You were given nausea medication.  Make sure that you are taking stress dose steroids given your history of Addison's disease.  Make sure that you are staying hydrated.  Follow-up for any new or worsening symptoms.
